# Patient Record
Sex: Female | Born: 1970 | Race: White | Hispanic: No | Smoking: Never smoker
Health system: Southern US, Community
[De-identification: ages and names within clinical notes are randomized; demographics above are authoritative.]

## PROBLEM LIST (undated history)

## (undated) DIAGNOSIS — E039 Hypothyroidism, unspecified: Secondary | ICD-10-CM

## (undated) DIAGNOSIS — H579 Unspecified disorder of eye and adnexa: Secondary | ICD-10-CM

## (undated) DIAGNOSIS — N649 Disorder of breast, unspecified: Secondary | ICD-10-CM

## (undated) DIAGNOSIS — M797 Fibromyalgia: Secondary | ICD-10-CM

## (undated) DIAGNOSIS — C801 Malignant (primary) neoplasm, unspecified: Secondary | ICD-10-CM

## (undated) DIAGNOSIS — H3552 Pigmentary retinal dystrophy: Secondary | ICD-10-CM

## (undated) DIAGNOSIS — E079 Disorder of thyroid, unspecified: Secondary | ICD-10-CM

## (undated) DIAGNOSIS — C50919 Malignant neoplasm of unspecified site of unspecified female breast: Secondary | ICD-10-CM

## (undated) DIAGNOSIS — T7840XA Allergy, unspecified, initial encounter: Secondary | ICD-10-CM

## (undated) HISTORY — PX: BREAST SURGERY: SHX581

## (undated) HISTORY — PX: PORTACATH PLACEMENT: SHX2246

## (undated) HISTORY — DX: Disorder of breast, unspecified: N64.9

## (undated) HISTORY — DX: Allergy, unspecified, initial encounter: T78.40XA

## (undated) HISTORY — PX: CHOLECYSTECTOMY: SHX55

## (undated) HISTORY — PX: PORT-A-CATH REMOVAL: SHX5289

## (undated) HISTORY — DX: Disorder of thyroid, unspecified: E07.9

---
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
## 1999-02-02 ENCOUNTER — Inpatient Hospital Stay (HOSPITAL_COMMUNITY): Admission: AD | Admit: 1999-02-02 | Discharge: 1999-02-04 | Payer: Self-pay | Admitting: *Deleted

## 1999-02-03 ENCOUNTER — Encounter: Payer: Self-pay | Admitting: Obstetrics & Gynecology

## 1999-02-05 ENCOUNTER — Inpatient Hospital Stay (HOSPITAL_COMMUNITY): Admission: AD | Admit: 1999-02-05 | Discharge: 1999-02-05 | Payer: Self-pay | Admitting: Obstetrics

## 1999-02-11 ENCOUNTER — Inpatient Hospital Stay (HOSPITAL_COMMUNITY): Admission: AD | Admit: 1999-02-11 | Discharge: 1999-02-11 | Payer: Self-pay | Admitting: *Deleted

## 1999-08-21 ENCOUNTER — Other Ambulatory Visit: Admission: RE | Admit: 1999-08-21 | Discharge: 1999-08-21 | Payer: Self-pay | Admitting: Obstetrics and Gynecology

## 1999-12-11 ENCOUNTER — Inpatient Hospital Stay (HOSPITAL_COMMUNITY): Admission: AD | Admit: 1999-12-11 | Discharge: 1999-12-11 | Payer: Self-pay | Admitting: Obstetrics and Gynecology

## 2003-02-02 ENCOUNTER — Other Ambulatory Visit: Admission: RE | Admit: 2003-02-02 | Discharge: 2003-02-02 | Payer: Self-pay | Admitting: Family Medicine

## 2005-06-16 ENCOUNTER — Ambulatory Visit: Payer: Self-pay | Admitting: Family Medicine

## 2013-03-17 ENCOUNTER — Encounter (HOSPITAL_COMMUNITY): Payer: Self-pay

## 2013-03-17 ENCOUNTER — Emergency Department (HOSPITAL_COMMUNITY)
Admission: EM | Admit: 2013-03-17 | Discharge: 2013-03-17 | Disposition: A | Discharge status: Home / Self Care | Payer: Medicaid Other | Attending: Emergency Medicine | Admitting: Emergency Medicine

## 2013-03-17 DIAGNOSIS — Z8669 Personal history of other diseases of the nervous system and sense organs: Secondary | ICD-10-CM | POA: Insufficient documentation

## 2013-03-17 DIAGNOSIS — H9209 Otalgia, unspecified ear: Secondary | ICD-10-CM | POA: Insufficient documentation

## 2013-03-17 DIAGNOSIS — H9203 Otalgia, bilateral: Secondary | ICD-10-CM

## 2013-03-17 HISTORY — DX: Unspecified disorder of eye and adnexa: H57.9

## 2013-03-17 MED ORDER — PSEUDOEPHEDRINE HCL 60 MG PO TABS: 60.0000 mg | ORAL_TABLET | Freq: Four times a day (QID) | ORAL | Status: DC | PRN

## 2013-03-17 NOTE — ED Notes (Signed)
Bilateral ear pain for 1 week.alert, NAD

## 2013-03-17 NOTE — ED Notes (Signed)
Pt reports bil earaches for 1 week.

## 2013-03-20 NOTE — ED Provider Notes (Signed)
CSN: [id number]     Arrival date & time 03/17/13  1059 History   First MD Initiated Contact with Patient 03/17/13 1105     Chief Complaint  Patient presents with  . Otalgia   (Consider location/radiation/quality/duration/timing/severity/associated sxs/prior Treatment) Patient is a 42 y.o. female presenting with ear pain. The history is provided by the patient.  Otalgia Location:  Bilateral Behind ear:  No abnormality Quality:  Aching and pressure Severity:  Mild Onset quality:  Gradual Duration:  1 week Timing:  Sporadic Progression:  Unchanged Chronicity:  New Relieved by:  Nothing Worsened by:  Nothing tried Ineffective treatments:  None tried Associated symptoms: congestion   Associated symptoms: no abdominal pain, no cough, no ear discharge, no fever, no headaches, no hearing loss, no neck pain, no rash, no rhinorrhea, no sore throat and no tinnitus   Congestion:    Location:  Nasal   Interferes with sleep: no     Interferes with eating/drinking: no     Past Medical History  Diagnosis Date  . Eye disease    Past Surgical History  Procedure Laterality Date  . Cholecystectomy     No family history on file. History  Substance Use Topics  . Smoking status: Never Smoker   . Smokeless tobacco: Not on file  . Alcohol Use: No   OB History   Grav Para Term Preterm Abortions TAB SAB Ect Mult Living                 Review of Systems  Constitutional: Negative for fever.  HENT: Positive for ear pain and congestion. Negative for hearing loss, sore throat, rhinorrhea, neck pain, tinnitus and ear discharge.   Eyes: Negative.   Respiratory: Negative for cough, chest tightness and shortness of breath.   Cardiovascular: Negative for chest pain.  Gastrointestinal: Negative for nausea and abdominal pain.  Genitourinary: Negative.   Musculoskeletal: Negative for joint swelling and arthralgias.  Skin: Negative.  Negative for rash and wound.  Neurological: Negative for  dizziness, weakness, light-headedness, numbness and headaches.  Psychiatric/Behavioral: Negative.     Allergies  Sulfamethoxazole w-trimethoprim  Home Medications   Current Outpatient Rx  Name  Route  Sig  Dispense  Refill  . pseudoephedrine (SUDAFED) 60 MG tablet   Oral   Take 1 tablet (60 mg total) by mouth every 6 (six) hours as needed for congestion (and ear pressure).   30 tablet   0    BP 115/77  Pulse 84  Temp(Src) 98 F (36.7 C) (Oral)  Resp 18  Ht 5\' 6"  (1.676 m)  Wt 170 lb (77.111 kg)  BMI 27.45 kg/m2  SpO2 100%  LMP 03/01/2013 Physical Exam  Constitutional: She is oriented to person, place, and time. She appears well-developed and well-nourished.  HENT:  Head: Normocephalic and atraumatic.  Right Ear: Tympanic membrane and ear canal normal. No drainage or swelling. No mastoid tenderness. Tympanic membrane is not injected and not retracted.  Left Ear: Tympanic membrane and ear canal normal. No drainage or swelling. No mastoid tenderness. Tympanic membrane is not injected and not retracted.  Nose: Mucosal edema present. No rhinorrhea.  Mouth/Throat: Uvula is midline, oropharynx is clear and moist and mucous membranes are normal. No oropharyngeal exudate, posterior oropharyngeal edema, posterior oropharyngeal erythema or tonsillar abscesses.  Eyes: Conjunctivae are normal.  Cardiovascular: Normal rate and normal heart sounds.   Pulmonary/Chest: Effort normal. No respiratory distress. She has no decreased breath sounds. She has no wheezes. She has no rhonchi. She  has no rales.  Abdominal: Soft. There is no tenderness.  Musculoskeletal: Normal range of motion.  Neurological: She is alert and oriented to person, place, and time.  Skin: Skin is warm and dry. No rash noted.  Psychiatric: She has a normal mood and affect.    ED Course  Procedures (including critical care time) Labs Review Labs Reviewed - No data to display Imaging Review No results found.  MDM    1. Earache, bilateral    Eustachian dysfunction with nasal congestion.  Pt prescribed pseudoephedrine,  Encouraged menthol drops, peppermints, auralgan.  F/u with pcp if not improving.    The patient appears reasonably screened and/or stabilized for discharge and I doubt any other medical condition or other [facility] requiring further screening, evaluation, or treatment in the ED at this time prior to discharge.     Burgess Amor, PA-C 03/20/13 2308

## 2013-03-23 NOTE — ED Provider Notes (Signed)
Medical screening examination/treatment/procedure(s) were performed by non-physician practitioner and as supervising physician I was immediately available for consultation/collaboration.  Aryia Delira, MD 03/23/13 1103 

## 2013-07-13 ENCOUNTER — Emergency Department (HOSPITAL_COMMUNITY)
Admission: EM | Admit: 2013-07-13 | Discharge: 2013-07-13 | Disposition: A | Discharge status: Home / Self Care | Payer: Medicaid Other | Attending: Emergency Medicine | Admitting: Emergency Medicine

## 2013-07-13 ENCOUNTER — Emergency Department (HOSPITAL_COMMUNITY): Payer: Medicaid Other

## 2013-07-13 ENCOUNTER — Encounter (HOSPITAL_COMMUNITY): Payer: Self-pay | Admitting: Emergency Medicine

## 2013-07-13 DIAGNOSIS — R509 Fever, unspecified: Secondary | ICD-10-CM | POA: Insufficient documentation

## 2013-07-13 DIAGNOSIS — IMO0001 Reserved for inherently not codable concepts without codable children: Secondary | ICD-10-CM | POA: Insufficient documentation

## 2013-07-13 DIAGNOSIS — J069 Acute upper respiratory infection, unspecified: Secondary | ICD-10-CM | POA: Insufficient documentation

## 2013-07-13 DIAGNOSIS — R6889 Other general symptoms and signs: Secondary | ICD-10-CM | POA: Insufficient documentation

## 2013-07-13 DIAGNOSIS — Z8669 Personal history of other diseases of the nervous system and sense organs: Secondary | ICD-10-CM | POA: Insufficient documentation

## 2013-07-13 HISTORY — DX: Fibromyalgia: M79.7

## 2013-07-13 MED ORDER — IBUPROFEN 800 MG PO TABS: 800.0000 mg | ORAL_TABLET | Freq: Three times a day (TID) | ORAL | Status: DC

## 2013-07-13 MED ORDER — GUAIFENESIN-CODEINE 100-10 MG/5ML PO SYRP: 10.0000 mL | ORAL_SOLUTION | Freq: Three times a day (TID) | ORAL | Status: DC | PRN

## 2013-07-13 NOTE — Discharge Instructions (Signed)
Cough, Adult  A cough is a reflex. It helps you clear your throat and airways. A cough can help heal your body. A cough can last 2 or 3 weeks (acute) or may last more than 8 weeks (chronic). Some common causes of a cough can include an infection, allergy, or a cold. HOME CARE  Only take medicine as told by your doctor.  If given, take your medicines (antibiotics) as told. Finish them even if you start to feel better.  Use a cold steam vaporizer or humidier in your home. This can help loosen thick spit (secretions).  Sleep so you are almost sitting up (semi-upright). Use pillows to do this. This helps reduce coughing.  Rest as needed.  Stop smoking if you smoke. GET HELP RIGHT AWAY IF:  You have yellowish-white fluid (pus) in your thick spit.  Your cough gets worse.  Your medicine does not reduce coughing, and you are losing sleep.  You cough up blood.  You have trouble breathing.  Your pain gets worse and medicine does not help.  You have a fever. MAKE SURE YOU:   Understand these instructions.  Will watch your condition.  Will get help right away if you are not doing well or get worse. Document Released: 02/19/2011 Document Revised: 08/31/2011 Document Reviewed: 02/19/2011 [facility] Patient Information 2014 [place].  Upper Respiratory Infection, Adult An upper respiratory infection (URI) is also known as the common cold. It is often caused by a type of germ (virus). Colds are easily spread (contagious). You can pass it to others by kissing, coughing, sneezing, or drinking out of the same glass. Usually, you get better in 1 or 2 weeks.  HOME CARE   Only take medicine as told by your doctor.  Use a warm mist humidifier or breathe in steam from a hot shower.  Drink enough water and fluids to keep your pee (urine) clear or pale yellow.  Get plenty of rest.  Return to work when your temperature is back to normal or as told by your doctor. You may use a face mask  and wash your hands to stop your cold from spreading. GET HELP RIGHT AWAY IF:   After the first few days, you feel you are getting worse.  You have questions about your medicine.  You have chills, shortness of breath, or brown or red spit (mucus).  You have yellow or brown snot (nasal discharge) or pain in the face, especially when you bend forward.  You have a fever, puffy (swollen) neck, pain when you swallow, or white spots in the back of your throat.  You have a bad headache, ear pain, sinus pain, or chest pain.  You have a high-pitched whistling sound when you breathe in and out (wheezing).  You have a lasting cough or cough up blood.  You have sore muscles or a stiff neck. MAKE SURE YOU:   Understand these instructions.  Will watch your condition.  Will get help right away if you are not doing well or get worse. Document Released: 11/25/2007 Document Revised: 08/31/2011 Document Reviewed: 10/13/2010 [facility] Patient Information 2014 [place], [place].

## 2013-07-13 NOTE — ED Notes (Signed)
Pt c/o cough, congestion, fever, body aches x 2 weeks.

## 2013-07-15 NOTE — ED Provider Notes (Signed)
CSN: [id number]     Arrival date & time 07/13/13  1031 History   First MD Initiated Contact with Patient 07/13/13 1118     Chief Complaint  Patient presents with  . URI   (Consider location/radiation/quality/duration/timing/severity/associated sxs/prior Treatment) Patient is a 43 y.o. female presenting with URI. The history is provided by the patient.  URI Presenting symptoms: congestion, cough, fever and rhinorrhea   Presenting symptoms: no facial pain and no sore throat   Congestion:    Location:  Nasal and chest   Interferes with sleep: no     Interferes with eating/drinking: no   Cough:    Cough characteristics:  Non-productive   Severity:  Mild   Onset quality:  Gradual   Duration:  2 weeks   Timing:  Intermittent   Progression:  Unchanged   Chronicity:  New Fever:    Timing:  Intermittent   Temp source:  Subjective   Progression:  Unable to specify Rhinorrhea:    Quality:  Clear   Severity:  Moderate   Timing:  Constant   Progression:  Unchanged Severity:  Mild Progression:  Unchanged Worsened by:  Nothing tried Ineffective treatments:  None tried Associated symptoms: myalgias and sneezing   Associated symptoms: no arthralgias, no headaches, no neck pain, no sinus pain, no swollen glands and no wheezing   Myalgias:    Location:  Generalized   Quality:  Aching   Severity:  Moderate   Onset quality:  Gradual   Progression:  Waxing and waning Risk factors: sick contacts   Risk factors: not elderly, no chronic respiratory disease and no immunosuppression     Past Medical History  Diagnosis Date  . Eye disease   . Fibromyalgia    Past Surgical History  Procedure Laterality Date  . Cholecystectomy     No family history on file. History  Substance Use Topics  . Smoking status: Never Smoker   . Smokeless tobacco: Not on file  . Alcohol Use: No   OB History   Grav Para Term Preterm Abortions TAB SAB Ect Mult Living                 Review of Systems   Constitutional: Positive for fever. Negative for chills, activity change and appetite change.  HENT: Positive for congestion, rhinorrhea and sneezing. Negative for sore throat.   Respiratory: Positive for cough. Negative for chest tightness, shortness of breath and wheezing.   Gastrointestinal: Negative for nausea, vomiting and abdominal pain.  Genitourinary: Negative for dysuria, frequency and flank pain.  Musculoskeletal: Positive for myalgias. Negative for arthralgias and neck pain.  Neurological: Negative for weakness, numbness and headaches.  All other systems reviewed and are negative.    Allergies  Sulfamethoxazole-trimethoprim  Home Medications   Current Outpatient Rx  Name  Route  Sig  Dispense  Refill  . guaiFENesin-codeine (ROBITUSSIN AC) 100-10 MG/5ML syrup   Oral   Take 10 mLs by mouth 3 (three) times daily as needed for cough.   120 mL   0   . ibuprofen (ADVIL,MOTRIN) 800 MG tablet   Oral   Take 1 tablet (800 mg total) by mouth 3 (three) times daily.   21 tablet   0    BP 124/82  Pulse 117  Temp(Src) 99 F (37.2 C) (Oral)  Resp 20  Ht 5\' 6"  (1.676 m)  Wt 170 lb (77.111 kg)  BMI 27.45 kg/m2  SpO2 100%  LMP 06/13/2013 Physical Exam  Nursing note and vitals  reviewed. Constitutional: She is oriented to person, place, and time. She appears well-developed and well-nourished. No distress.  HENT:  Head: Normocephalic and atraumatic.  Right Ear: Tympanic membrane and ear canal normal.  Left Ear: Tympanic membrane and ear canal normal.  Nose: Mucosal edema and rhinorrhea present.  Mouth/Throat: Uvula is midline and mucous membranes are normal. Posterior oropharyngeal erythema present. No oropharyngeal exudate, posterior oropharyngeal edema or tonsillar abscesses.  Neck: Normal range of motion, full passive range of motion without pain and phonation normal. Neck supple. No Brudzinski's sign and no Kernig's sign noted.  Cardiovascular: Normal rate, regular  rhythm, normal heart sounds and intact distal pulses.   No murmur heard. Pulmonary/Chest: Effort normal and breath sounds normal. No respiratory distress.  Musculoskeletal: Normal range of motion.  Lymphadenopathy:    She has no cervical adenopathy.  Neurological: She is alert and oriented to person, place, and time. She exhibits normal muscle tone. Coordination normal.  Skin: Skin is warm and dry. No rash noted.    ED Course  Procedures (including critical care time) Labs Review Labs Reviewed - No data to display Imaging Review No results found.  EKG Interpretation   None       MDM   1. URI (upper respiratory infection)    Sx's likely related to viral illness.  VSS.  Pt is non-toxic appearing.  Agrees to symptomatic treatment with ibuprofen and robitussin AC    Michalle Rademaker L. Vanessa Spade, PA-C 07/15/13 2233

## 2013-07-16 NOTE — ED Provider Notes (Signed)
Medical screening examination/treatment/procedure(s) were performed by non-physician practitioner and as supervising physician I was immediately available for consultation/collaboration.  EKG Interpretation   None         Alfonzo Feller, DO 07/16/13 1130

## 2013-10-16 ENCOUNTER — Other Ambulatory Visit (HOSPITAL_COMMUNITY): Payer: Self-pay | Admitting: Internal Medicine

## 2013-10-16 ENCOUNTER — Other Ambulatory Visit (HOSPITAL_COMMUNITY): Payer: Self-pay | Admitting: Family Medicine

## 2013-10-16 DIAGNOSIS — Z1231 Encounter for screening mammogram for malignant neoplasm of breast: Secondary | ICD-10-CM

## 2013-10-19 ENCOUNTER — Ambulatory Visit (HOSPITAL_COMMUNITY)
Admission: RE | Admit: 2013-10-19 | Discharge: 2013-10-19 | Disposition: A | Discharge status: Home / Self Care | Payer: Medicaid Other | Source: Ambulatory Visit | Attending: Internal Medicine | Admitting: Internal Medicine

## 2013-10-19 DIAGNOSIS — Z1231 Encounter for screening mammogram for malignant neoplasm of breast: Secondary | ICD-10-CM | POA: Insufficient documentation

## 2013-10-27 ENCOUNTER — Encounter: Payer: Self-pay | Admitting: *Deleted

## 2013-10-31 ENCOUNTER — Other Ambulatory Visit: Payer: Self-pay | Admitting: Adult Health

## 2013-11-07 ENCOUNTER — Ambulatory Visit (INDEPENDENT_AMBULATORY_CARE_PROVIDER_SITE_OTHER): Payer: Medicaid Other | Admitting: Adult Health

## 2013-11-07 ENCOUNTER — Other Ambulatory Visit (HOSPITAL_COMMUNITY)
Admission: RE | Admit: 2013-11-07 | Discharge: 2013-11-07 | Disposition: A | Discharge status: Home / Self Care | Payer: Medicaid Other | Source: Ambulatory Visit | Attending: Adult Health | Admitting: Adult Health

## 2013-11-07 ENCOUNTER — Encounter: Payer: Self-pay | Admitting: Adult Health

## 2013-11-07 VITALS — BP 120/72 | HR 78 | Ht 65.0 in | Wt 180.0 lb

## 2013-11-07 DIAGNOSIS — Z01419 Encounter for gynecological examination (general) (routine) without abnormal findings: Secondary | ICD-10-CM | POA: Insufficient documentation

## 2013-11-07 DIAGNOSIS — Z0000 Encounter for general adult medical examination without abnormal findings: Secondary | ICD-10-CM

## 2013-11-07 DIAGNOSIS — Z1151 Encounter for screening for human papillomavirus (HPV): Secondary | ICD-10-CM | POA: Insufficient documentation

## 2013-11-07 DIAGNOSIS — Z1212 Encounter for screening for malignant neoplasm of rectum: Secondary | ICD-10-CM

## 2013-11-07 LAB — HEMOCCULT GUIAC POC 1CARD (OFFICE): FECAL OCCULT BLD: NEGATIVE

## 2013-11-07 NOTE — Progress Notes (Signed)
Patient ID: Kathryn Skinner, female   DOB: 1970/12/29, 43 y.o.   MRN: [id number] History of Present Illness: Kathryn Skinner is a 43 year old white female, single,new to this practice in for a pap and physical.   Current Medications, Allergies, Past Medical History, Past Surgical History, Family History and Social History were reviewed in [facility] record.     Review of Systems: Patient denies any headaches, blurred vision, shortness of breath, chest pain, abdominal pain, problems with bowel movements, urination, or intercourse. No joint swelling or mood swings. She does not use birth control, has been with partner over 2 years.   Physical Exam:BP 120/72  Pulse 78  Ht 5\' 5"  (1.651 m)  Wt 180 lb (81.647 kg)  BMI 29.95 kg/m2  LMP 11/02/2013 General:  Well developed, well nourished, no acute distress Skin:  Warm and dry Neck:  Midline trachea, normal thyroid Lungs; Clear to auscultation bilaterally Breast:  No dominant palpable mass, retraction, or nipple discharge Cardiovascular: Regular rate and rhythm Abdomen:  Soft, non tender, no hepatosplenomegaly Pelvic:  External genitalia is normal in appearance.  The vagina is normal in appearance. The cervix is bulbous, with several nabothian cysts, pap performed with HPV.  Uterus is felt to be normal size, shape, and contour.  No adnexal masses or tenderness noted. Rectal: Good sphincter tone, no polyps, or hemorrhoids felt.  Hemoccult negative. Extremities:  No swelling or varicosities noted Psych:  No mood changes, alert and cooperative,seems happy   Impression: Yearly gyn exam    Plan: Physical in 1 year Pap in 3 years if HPV negative Mammogram yearly Labs with PCP

## 2013-11-07 NOTE — Patient Instructions (Signed)
Physical in 1 year Mammogram yearly Labs PCP

## 2014-04-23 ENCOUNTER — Encounter: Payer: Self-pay | Admitting: Adult Health

## 2014-06-22 DIAGNOSIS — C50919 Malignant neoplasm of unspecified site of unspecified female breast: Secondary | ICD-10-CM

## 2014-06-22 DIAGNOSIS — C801 Malignant (primary) neoplasm, unspecified: Secondary | ICD-10-CM

## 2014-06-22 HISTORY — DX: Malignant neoplasm of unspecified site of unspecified female breast: C50.919

## 2014-06-22 HISTORY — DX: Malignant (primary) neoplasm, unspecified: C80.1

## 2015-06-23 HISTORY — PX: MASTECTOMY: SHX3

## 2015-09-13 DIAGNOSIS — Z9013 Acquired absence of bilateral breasts and nipples: Secondary | ICD-10-CM | POA: Insufficient documentation

## 2015-09-17 ENCOUNTER — Emergency Department (HOSPITAL_COMMUNITY)
Admission: EM | Admit: 2015-09-17 | Discharge: 2015-09-17 | Disposition: A | Discharge status: Home / Self Care | Payer: Medicaid Other

## 2015-09-17 NOTE — ED Notes (Signed)
Called x 2 no answer

## 2015-09-17 NOTE — ED Notes (Signed)
Pt called to triage x 2 not in waiting area.

## 2015-09-17 NOTE — ED Notes (Signed)
Called x 1 no answer

## 2015-11-24 ENCOUNTER — Encounter (HOSPITAL_COMMUNITY): Payer: Self-pay

## 2015-11-24 ENCOUNTER — Inpatient Hospital Stay (HOSPITAL_COMMUNITY)
Admission: EM | Admit: 2015-11-24 | Discharge: 2015-11-28 | DRG: 392 | Disposition: A | Discharge status: Home / Self Care | Payer: Medicaid Other | Attending: Internal Medicine | Admitting: Internal Medicine

## 2015-11-24 DIAGNOSIS — E860 Dehydration: Secondary | ICD-10-CM | POA: Diagnosis present

## 2015-11-24 DIAGNOSIS — R197 Diarrhea, unspecified: Secondary | ICD-10-CM | POA: Diagnosis present

## 2015-11-24 DIAGNOSIS — E876 Hypokalemia: Secondary | ICD-10-CM | POA: Diagnosis present

## 2015-11-24 DIAGNOSIS — K529 Noninfective gastroenteritis and colitis, unspecified: Principal | ICD-10-CM | POA: Diagnosis present

## 2015-11-24 DIAGNOSIS — Z888 Allergy status to other drugs, medicaments and biological substances status: Secondary | ICD-10-CM

## 2015-11-24 DIAGNOSIS — Z9221 Personal history of antineoplastic chemotherapy: Secondary | ICD-10-CM

## 2015-11-24 DIAGNOSIS — E039 Hypothyroidism, unspecified: Secondary | ICD-10-CM | POA: Diagnosis present

## 2015-11-24 DIAGNOSIS — Z9049 Acquired absence of other specified parts of digestive tract: Secondary | ICD-10-CM

## 2015-11-24 DIAGNOSIS — Z22322 Carrier or suspected carrier of Methicillin resistant Staphylococcus aureus: Secondary | ICD-10-CM

## 2015-11-24 DIAGNOSIS — E870 Hyperosmolality and hypernatremia: Secondary | ICD-10-CM | POA: Diagnosis present

## 2015-11-24 DIAGNOSIS — E878 Other disorders of electrolyte and fluid balance, not elsewhere classified: Secondary | ICD-10-CM | POA: Diagnosis present

## 2015-11-24 DIAGNOSIS — M797 Fibromyalgia: Secondary | ICD-10-CM | POA: Diagnosis present

## 2015-11-24 DIAGNOSIS — Z79899 Other long term (current) drug therapy: Secondary | ICD-10-CM

## 2015-11-24 DIAGNOSIS — C50919 Malignant neoplasm of unspecified site of unspecified female breast: Secondary | ICD-10-CM | POA: Diagnosis present

## 2015-11-24 DIAGNOSIS — T451X5A Adverse effect of antineoplastic and immunosuppressive drugs, initial encounter: Secondary | ICD-10-CM | POA: Diagnosis present

## 2015-11-24 DIAGNOSIS — R000 Tachycardia, unspecified: Secondary | ICD-10-CM | POA: Diagnosis present

## 2015-11-24 DIAGNOSIS — Z923 Personal history of irradiation: Secondary | ICD-10-CM

## 2015-11-24 HISTORY — DX: Malignant (primary) neoplasm, unspecified: C80.1

## 2015-11-24 LAB — COMPREHENSIVE METABOLIC PANEL
ALK PHOS: 102 U/L (ref 38–126)
ALT: 25 U/L (ref 14–54)
AST: 29 U/L (ref 15–41)
Albumin: 3.1 g/dL — ABNORMAL LOW (ref 3.5–5.0)
Anion gap: 9 (ref 5–15)
BUN: 12 mg/dL (ref 6–20)
CALCIUM: 8.4 mg/dL — AB (ref 8.9–10.3)
CO2: 23 mmol/L (ref 22–32)
CREATININE: 0.99 mg/dL (ref 0.44–1.00)
Chloride: 104 mmol/L (ref 101–111)
Glucose, Bld: 138 mg/dL — ABNORMAL HIGH (ref 65–99)
Potassium: 2.8 mmol/L — ABNORMAL LOW (ref 3.5–5.1)
SODIUM: 136 mmol/L (ref 135–145)
Total Bilirubin: 0.9 mg/dL (ref 0.3–1.2)
Total Protein: 6 g/dL — ABNORMAL LOW (ref 6.5–8.1)

## 2015-11-24 LAB — CBC
HCT: 38.3 % (ref 36.0–46.0)
Hemoglobin: 13.1 g/dL (ref 12.0–15.0)
MCH: 29.4 pg (ref 26.0–34.0)
MCHC: 34.2 g/dL (ref 30.0–36.0)
MCV: 85.9 fL (ref 78.0–100.0)
PLATELETS: 235 10*3/uL (ref 150–400)
RBC: 4.46 MIL/uL (ref 3.87–5.11)
RDW: 18.7 % — ABNORMAL HIGH (ref 11.5–15.5)
WBC: 5 10*3/uL (ref 4.0–10.5)

## 2015-11-24 LAB — I-STAT BETA HCG BLOOD, ED (MC, WL, AP ONLY)

## 2015-11-24 LAB — I-STAT CG4 LACTIC ACID, ED: LACTIC ACID, VENOUS: 1.51 mmol/L (ref 0.5–2.0)

## 2015-11-24 LAB — LIPASE, BLOOD: Lipase: 32 U/L (ref 11–51)

## 2015-11-24 MED ORDER — IOPAMIDOL (ISOVUE-300) INJECTION 61%
INTRAVENOUS | Status: AC
  Administered 2015-11-25: 100 mL
  Filled 2015-11-24: qty 100

## 2015-11-24 MED ORDER — HYDROMORPHONE HCL 1 MG/ML IJ SOLN
0.5000 mg | Freq: Once | INTRAMUSCULAR | Status: AC
  Administered 2015-11-24: 0.5 mg via INTRAVENOUS
  Filled 2015-11-24: qty 1

## 2015-11-24 MED ORDER — SODIUM CHLORIDE 0.9 % IV BOLUS (SEPSIS)
1000.0000 mL | Freq: Once | INTRAVENOUS | Status: AC
  Administered 2015-11-24: 1000 mL via INTRAVENOUS

## 2015-11-24 MED ORDER — DICYCLOMINE HCL 10 MG/ML IM SOLN
20.0000 mg | Freq: Once | INTRAMUSCULAR | Status: AC
Start: 2015-11-24 — End: 2015-11-24
  Administered 2015-11-24: 20 mg via INTRAMUSCULAR
  Filled 2015-11-24: qty 2

## 2015-11-24 MED ORDER — POTASSIUM CHLORIDE 10 MEQ/100ML IV SOLN
10.0000 meq | Freq: Once | INTRAVENOUS | Status: AC
  Administered 2015-11-24: 10 meq via INTRAVENOUS
  Filled 2015-11-24: qty 100

## 2015-11-24 MED ORDER — ONDANSETRON HCL 4 MG/2ML IJ SOLN
4.0000 mg | Freq: Once | INTRAMUSCULAR | Status: AC
  Administered 2015-11-24: 4 mg via INTRAVENOUS
  Filled 2015-11-24: qty 2

## 2015-11-24 NOTE — ED Provider Notes (Signed)
MSE was initiated and I personally evaluated the patient and placed orders (if any) at  10:24 PM on November 24, 2015.  The patient appears stable so that the remainder of the MSE may be completed by another provider.  45 y.o. female with active breast cancer, currently undergoing radiation. Chemo recently stopped due to severe GI side effects. Patient reports 2 weeks of inability to tolerate PO intake and copious watery diarrhea. Tachycardic to 135. Fluids ordered.   Berenice Primas, MD 11/24/15 [id number]  Elnora Morrison, MD 11/24/15 [phone]

## 2015-11-24 NOTE — ED Notes (Signed)
Patient complains of abdominal pain with 2 weeks of diarrhea and emesis with any intake. Patient is currently receiving radiation for breast cancer and was taken off oral chemo this past Friday due to the GI symptoms. Has taken 2 boxes of immodium with no relief. Complains of extreme weakness

## 2015-11-24 NOTE — ED Provider Notes (Signed)
CSN: [id number]     Arrival date & time 11/24/15  2112 History   First MD Initiated Contact with Patient 11/24/15 2228     Chief Complaint  Patient presents with  . Abdominal Pain  . Diarrhea     (Consider location/radiation/quality/duration/timing/severity/associated sxs/prior Treatment) HPI   Patient is a 45 year old female history of breast cancer currently receiving radiation treatment and oral chemotherapy which was stopped Friday morning 2 days ago for severe GI side effects including profuse watery diarrhea and abdominal pain. Patient estimates 5 watery bowel movements the past 24 hours. She reports using 2 entire boxes of Imodium without any improvement in her stools. Frequent watery stools have been persistent like this for the past 2 weeks, foul-smelling. She states that eating or drinking anything causes worsening abdominal pain that is more intense in the left lower quadrant with some radiation to her left side and left flank. She denies any vomiting. Patient denies any recent hospitalization, last hospitalization was in March. No known sick contacts, no recent travel, no recent antibiotic use. The past 6 months she was receiving chemotherapy infusions and 2 weeks ago she was transitioned to oral chemotherapy which coincides when her symptoms began. Her oncologist is Dr. Georgiann Cocker in [place], Friday morning she took her last dose of oral chemotherapy at the direction of her oncologist to advise her to stop because of her severe GI symptoms.  She denies any fevers, chills, sweats.  No rash.  She has chest wall pain where has burns secondary to radiation therapy but she otherwise denies any chest pain, shortness of breath.    Past Medical History  Diagnosis Date  . Eye disease   . Fibromyalgia   . Cancer [facility])    Past Surgical History  Procedure Laterality Date  . Cholecystectomy     Family History  Problem Relation Age of Onset  . Thyroid disease Mother   . Thyroid disease  Sister   . Asthma Sister   . Thyroid disease Maternal Grandmother   . Emphysema Maternal Grandmother   . Liver disease Maternal Aunt   . Alcohol abuse Maternal Grandfather    Social History  Substance Use Topics  . Smoking status: Never Smoker   . Smokeless tobacco: Never Used  . Alcohol Use: No   OB History    Gravida Para Term Preterm AB TAB SAB Ectopic Multiple Living   2 1   1  1   1      Review of Systems  All other systems reviewed and are negative.     Allergies  Sulfamethoxazole-trimethoprim  Home Medications   Prior to Admission medications   Medication Sig Start Date End Date Taking? Authorizing Provider  levothyroxine (SYNTHROID, LEVOTHROID) 50 MCG tablet Take 50 mcg by mouth daily before breakfast. 11/20/15  Yes Historical Provider, MD  ondansetron (ZOFRAN) 4 MG tablet Take 4 mg by mouth every 4 (four) hours as needed for nausea or vomiting.  09/14/15  Yes Historical Provider, MD  PRESCRIPTION MEDICATION Take 1 tablet by mouth 2 (two) times daily. Unknown chemo drug from [facility] Provider, MD   BP 110/80 mmHg  Pulse 135  Temp(Src) 98.3 F (36.8 C) (Oral)  Resp 16  Ht 5\' 6"  (1.676 m)  Wt 78.155 kg  BMI 27.82 kg/m2  SpO2 97%  LMP 11/02/2013 Physical Exam  Constitutional: She is oriented to person, place, and time. She appears well-developed and well-nourished. No distress.  HENT:  Head: Normocephalic and atraumatic.  Nose:  Nose normal.  Mouth/Throat: Oropharynx is clear and moist. No oropharyngeal exudate.  Eyes: Conjunctivae and EOM are normal. Pupils are equal, round, and reactive to light. Right eye exhibits no discharge. Left eye exhibits no discharge. No scleral icterus.  Neck: Normal range of motion. No JVD present. No tracheal deviation present. No thyromegaly present.  Cardiovascular: Normal rate, regular rhythm, normal heart sounds and intact distal pulses.  Exam reveals no gallop and no friction rub.   No murmur heard. Difficult to  palpate bilateral radial pulses, extremities warm, normal capillary refill  Pulmonary/Chest: Effort normal and breath sounds normal. No respiratory distress. She has no wheezes. She has no rales. She exhibits tenderness.  S/p double mastectomy, right upper chest and right axillary skin with severe erythema, raw and sloughing off skin  Abdominal: Soft. Bowel sounds are normal. She exhibits no distension and no mass. There is tenderness. There is rebound. There is no guarding.  Generalized ttp with mild guarding and rebound tenderness, worse in epigastrum and LUQ, decreased BS x 4  Musculoskeletal: Normal range of motion. She exhibits no edema or tenderness.  Lymphadenopathy:    She has no cervical adenopathy.  Neurological: She is alert and oriented to person, place, and time. She has normal reflexes. No cranial nerve deficit. She exhibits normal muscle tone. Coordination normal.  Skin: Skin is warm and dry. No rash noted. She is not diaphoretic. There is erythema. No pallor.  Psychiatric: She has a normal mood and affect. Her behavior is normal. Judgment and thought content normal.  Nursing note and vitals reviewed.   ED Course  Procedures (including critical care time) Labs Review Labs Reviewed  COMPREHENSIVE METABOLIC PANEL - Abnormal; Notable for the following:    Potassium 2.8 (*)    Glucose, Bld 138 (*)    Calcium 8.4 (*)    Total Protein 6.0 (*)    Albumin 3.1 (*)    All other components within normal limits  CBC - Abnormal; Notable for the following:    RDW 18.7 (*)    All other components within normal limits  LIPASE, BLOOD  URINALYSIS, ROUTINE W REFLEX MICROSCOPIC (NOT AT [facility])  I-STAT CG4 LACTIC ACID, ED  I-STAT BETA HCG BLOOD, ED ([facility], [facility], [facility] ONLY)    Imaging Review No results found. I have personally reviewed and evaluated these images and lab results as part of my medical decision-making.   EKG Interpretation None      MDM   Patient is a 45 year old female  with breast cancer, actively undergoing radiation and chemotherapy, presents emergency Department with 2 weeks of severe generalized abdominal pain, worse when attempted to eat or drink anything, with severe frequent watery diarrhea.  Upon presentation to the ER she is initially tachycardic to 135.  MSE screening done by Dr. Marigene Ehlers, with basic labs, IV fluids and CT scan ordered.   In the ER she started to be hypokalemic, 2.8. Normal white count, differential added.  Patient continued to be tachycardic after 1 L normal saline.  10 mEq of potassium replacement were ordered IV, followed by 1L half-normal saline with 20 mEq of potassium administered over 2 hours, currently infusing.  Patient continues to be tachycardic. Her dental pain was improved with Bentyl, Zofran, Dilaudid.   CT abdomen pelvis is pertinent for colitis, suspicious for infectious etiology, recommended stool cultures. Since being in the ER the patient has not had any loose stools, discussed this with the patient and did order him a GI panel and C.  difficile, patient will alert [place] if she is able to provide a sample.  She was able to tolerate by mouth Cipro and Flagyl. Magnesium level added, will replace once resulted if needed.  Case discussed with Dr. Lossie Faes who agrees with admission for further treatment Tried hospitalist paged, discussed the case with Dr. Eulas Post, who will admit the patient to telemetry OBS for further workup and treatment of colitis, hypokalemia, sinus tachycardia secondary to dehydration/infection.  Final diagnoses:  Colitis  Hypokalemia  Diarrhea, unspecified type  Sinus tachycardia (HCC)      Delsa Grana, PA-C 11/25/15 0246  Everlene Balls, MD 11/25/15 [phone]

## 2015-11-25 ENCOUNTER — Emergency Department (HOSPITAL_COMMUNITY): Payer: Medicaid Other

## 2015-11-25 DIAGNOSIS — K529 Noninfective gastroenteritis and colitis, unspecified: Secondary | ICD-10-CM | POA: Diagnosis present

## 2015-11-25 DIAGNOSIS — R000 Tachycardia, unspecified: Secondary | ICD-10-CM

## 2015-11-25 DIAGNOSIS — R197 Diarrhea, unspecified: Secondary | ICD-10-CM | POA: Diagnosis present

## 2015-11-25 DIAGNOSIS — E860 Dehydration: Secondary | ICD-10-CM | POA: Diagnosis not present

## 2015-11-25 DIAGNOSIS — E876 Hypokalemia: Secondary | ICD-10-CM | POA: Diagnosis present

## 2015-11-25 LAB — DIFFERENTIAL
BASOS PCT: 0 %
Basophils Absolute: 0 10*3/uL (ref 0.0–0.1)
EOS PCT: 2 %
Eosinophils Absolute: 0.1 10*3/uL (ref 0.0–0.7)
Lymphocytes Relative: 12 %
Lymphs Abs: 0.6 10*3/uL — ABNORMAL LOW (ref 0.7–4.0)
MONO ABS: 1.4 10*3/uL — AB (ref 0.1–1.0)
MONOS PCT: 27 %
NEUTROS ABS: 3.2 10*3/uL (ref 1.7–7.7)
Neutrophils Relative %: 59 %

## 2015-11-25 LAB — C DIFFICILE QUICK SCREEN W PCR REFLEX
C DIFFICLE (CDIFF) ANTIGEN: NEGATIVE
C Diff interpretation: NEGATIVE
C Diff toxin: NEGATIVE

## 2015-11-25 LAB — GASTROINTESTINAL PANEL BY PCR, STOOL (REPLACES STOOL CULTURE)

## 2015-11-25 LAB — MAGNESIUM: Magnesium: 1.5 mg/dL — ABNORMAL LOW (ref 1.7–2.4)

## 2015-11-25 LAB — BASIC METABOLIC PANEL
Anion gap: 5 (ref 5–15)
BUN: 12 mg/dL (ref 6–20)
CHLORIDE: 109 mmol/L (ref 101–111)
CO2: 21 mmol/L — AB (ref 22–32)
Calcium: 7.5 mg/dL — ABNORMAL LOW (ref 8.9–10.3)
Creatinine, Ser: 0.76 mg/dL (ref 0.44–1.00)
GFR calc Af Amer: >60 mL/min (ref >60)
GFR calc non Af Amer: >60 mL/min (ref >60)
Glucose, Bld: 101 mg/dL — ABNORMAL HIGH (ref 65–99)
POTASSIUM: 3.5 mmol/L (ref 3.5–5.1)
SODIUM: 135 mmol/L (ref 135–145)

## 2015-11-25 MED ORDER — MORPHINE SULFATE (PF) 2 MG/ML IV SOLN
2.0000 mg | INTRAVENOUS | Status: DC | PRN
  Administered 2015-11-26: 2 mg via INTRAVENOUS
  Filled 2015-11-25: qty 1

## 2015-11-25 MED ORDER — OXYCODONE HCL 5 MG PO TABS
5.0000 mg | ORAL_TABLET | ORAL | Status: DC | PRN
  Administered 2015-11-26: 5 mg via ORAL
  Filled 2015-11-25: qty 1

## 2015-11-25 MED ORDER — PROMETHAZINE HCL 25 MG/ML IJ SOLN
6.2500 mg | INTRAMUSCULAR | Status: DC | PRN
  Administered 2015-11-26: 6.25 mg via INTRAVENOUS
  Filled 2015-11-25: qty 1

## 2015-11-25 MED ORDER — ONDANSETRON HCL 4 MG/2ML IJ SOLN
4.0000 mg | Freq: Three times a day (TID) | INTRAMUSCULAR | Status: AC | PRN
  Administered 2015-11-25: 4 mg via INTRAVENOUS
  Filled 2015-11-25: qty 2

## 2015-11-25 MED ORDER — RADIAPLEXRX EX GEL
Freq: Once | CUTANEOUS | Status: AC
  Administered 2015-11-25: 16:00:00 via TOPICAL
  Filled 2015-11-25: qty 170

## 2015-11-25 MED ORDER — ENOXAPARIN SODIUM 40 MG/0.4ML ~~LOC~~ SOLN
40.0000 mg | SUBCUTANEOUS | Status: DC
  Administered 2015-11-25 – 2015-11-28 (×3): 40 mg via SUBCUTANEOUS
  Filled 2015-11-25 (×3): qty 0.4

## 2015-11-25 MED ORDER — METRONIDAZOLE IN NACL 5-0.79 MG/ML-% IV SOLN
500.0000 mg | Freq: Three times a day (TID) | INTRAVENOUS | Status: DC
  Administered 2015-11-25 – 2015-11-27 (×7): 500 mg via INTRAVENOUS
  Filled 2015-11-25 (×7): qty 100

## 2015-11-25 MED ORDER — DICYCLOMINE HCL 20 MG PO TABS
20.0000 mg | ORAL_TABLET | Freq: Three times a day (TID) | ORAL | Status: DC
  Administered 2015-11-25 – 2015-11-26 (×3): 20 mg via ORAL
  Filled 2015-11-25 (×6): qty 1

## 2015-11-25 MED ORDER — METRONIDAZOLE 500 MG PO TABS
500.0000 mg | ORAL_TABLET | Freq: Once | ORAL | Status: AC
  Administered 2015-11-25: 500 mg via ORAL
  Filled 2015-11-25: qty 1

## 2015-11-25 MED ORDER — ACETAMINOPHEN 325 MG PO TABS: 650.0000 mg | ORAL_TABLET | Freq: Four times a day (QID) | ORAL | Status: DC | PRN

## 2015-11-25 MED ORDER — POTASSIUM CHLORIDE 20 MEQ PO PACK
60.0000 meq | PACK | Freq: Two times a day (BID) | ORAL | Status: DC
  Administered 2015-11-25 (×2): 60 meq via ORAL
  Filled 2015-11-25 (×3): qty 3

## 2015-11-25 MED ORDER — LOPERAMIDE HCL 2 MG PO CAPS
2.0000 mg | ORAL_CAPSULE | ORAL | Status: DC | PRN
  Administered 2015-11-25 – 2015-11-26 (×2): 2 mg via ORAL
  Filled 2015-11-25 (×2): qty 1

## 2015-11-25 MED ORDER — CIPROFLOXACIN HCL 500 MG PO TABS
500.0000 mg | ORAL_TABLET | Freq: Once | ORAL | Status: AC
  Administered 2015-11-25: 500 mg via ORAL
  Filled 2015-11-25: qty 1

## 2015-11-25 MED ORDER — MAGNESIUM SULFATE IN D5W 1-5 GM/100ML-% IV SOLN
1.0000 g | INTRAVENOUS | Status: AC
  Administered 2015-11-25: 1 g via INTRAVENOUS
  Filled 2015-11-25 (×2): qty 100

## 2015-11-25 MED ORDER — SODIUM CHLORIDE 0.9% FLUSH
3.0000 mL | Freq: Two times a day (BID) | INTRAVENOUS | Status: DC
  Administered 2015-11-26 – 2015-11-27 (×2): 3 mL via INTRAVENOUS

## 2015-11-25 MED ORDER — LEVOFLOXACIN IN D5W 750 MG/150ML IV SOLN
750.0000 mg | INTRAVENOUS | Status: DC
  Administered 2015-11-25 – 2015-11-27 (×3): 750 mg via INTRAVENOUS
  Filled 2015-11-25 (×3): qty 150

## 2015-11-25 MED ORDER — HYDROMORPHONE HCL 1 MG/ML IJ SOLN
0.5000 mg | Freq: Once | INTRAMUSCULAR | Status: AC
  Administered 2015-11-25: 0.5 mg via INTRAVENOUS
  Filled 2015-11-25: qty 1

## 2015-11-25 MED ORDER — ACETAMINOPHEN 650 MG RE SUPP: 650.0000 mg | Freq: Four times a day (QID) | RECTAL | Status: DC | PRN

## 2015-11-25 MED ORDER — POTASSIUM CHLORIDE IN NACL 40-0.9 MEQ/L-% IV SOLN
INTRAVENOUS | Status: DC
  Administered 2015-11-25 (×2): 100 mL/h via INTRAVENOUS
  Administered 2015-11-26 – 2015-11-27 (×2): 125 mL/h via INTRAVENOUS
  Filled 2015-11-25 (×7): qty 1000

## 2015-11-25 MED ORDER — HYDROMORPHONE HCL 1 MG/ML IJ SOLN
0.5000 mg | INTRAMUSCULAR | Status: AC | PRN
  Administered 2015-11-25 (×2): 0.5 mg via INTRAVENOUS
  Filled 2015-11-25 (×2): qty 1

## 2015-11-25 MED ORDER — SODIUM CHLORIDE 0.45 % IV SOLN
Freq: Once | INTRAVENOUS | Status: DC
  Filled 2015-11-25: qty 1000

## 2015-11-25 MED ORDER — LEVOTHYROXINE SODIUM 50 MCG PO TABS
50.0000 ug | ORAL_TABLET | Freq: Every day | ORAL | Status: DC
  Administered 2015-11-25 – 2015-11-28 (×4): 50 ug via ORAL
  Filled 2015-11-25 (×4): qty 1

## 2015-11-25 NOTE — ED Notes (Signed)
Called main lab to add on differential

## 2015-11-25 NOTE — Progress Notes (Signed)
11/25/15  1530  Notified Dr Eliseo Squires regarding pop up screen states patient maybe Septic. BP 108/60 HR 116 T 98.3 R20.

## 2015-11-25 NOTE — Progress Notes (Signed)
Patient admitted after midnight, please see H&P.  Still with severe watery stools and cramping-- c diff negative.  Await GI panel.  ?affect of her chemo med  Eulogio Bear DO

## 2015-11-25 NOTE — ED Notes (Signed)
Admitting at bedside 

## 2015-11-25 NOTE — Progress Notes (Signed)
Pharmacy Antibiotic Note  TAUREAN GAD is a 45 y.o. female admitted on 11/24/2015 with intra-abdominal infection.  Pharmacy has been consulted for Levofloxacin dosing.  Pt with N/V x 2 days and diarrhea x 1 month.  Pt with hx breast CA, currently undergoing radiation treatments.  Her oral chemotherapy was stopped last Friday.  Cr < 1, Levofloxacin will not require adjustment.  She is also receiving Flagyl 500mg  IV q8.  Plan: Levofloxacin 750mg  IV q24 Pharmacy will sign off, please reconsult as needed.  Height: 5\' 6"  (167.6 cm) Weight: 174 lb 11.2 oz (79.243 kg) IBW/kg (Calculated) : 59.3  Temp (24hrs), Avg:98.4 F (36.9 C), Min:98.2 F (36.8 C), Max:98.7 F (37.1 C)   Recent Labs Lab 11/24/15 2130 11/24/15 2142  WBC 5.0  --   CREATININE 0.99  --   LATICACIDVEN  --  1.51    Estimated Creatinine Clearance: 76.2 mL/min (by C-G formula based on Cr of 0.99).    Allergies  Allergen Reactions  . Sulfamethoxazole-Trimethoprim Rash    Antimicrobials this admission: Flagyl 6/5 >>  Levoflox 6/5 >>   Microbiology results: 6/5  Stool cx (p) 6/5  CDiff neg  Thank you for allowing pharmacy to be a part of this patient's care.  Gracy Bruins, PharmD Clinical Pharmacist [facility]

## 2015-11-25 NOTE — Consult Note (Signed)
WOC wound consult note Reason for Consult: radiation burns right axilla  Wound type: wet desquamation right axilla  Measurement: pt not able to really raise the arm much for accurate measurements Wound bed: pink, wet/moist, some yellow exudate consistent with radiation burns Drainage (amount, consistency, odor) see above Periwound: dry desquamation mid chest  Dressing procedure/placement/frequency: Ordered Radiplex gel from [facility], to be over to floor for patient use in a hour or so.  Notified bedside nurse.  Discussed POC with patient and bedside nurse.  Re consult if needed, will not follow at this time. Thanks  Myrtle Haller [place], [place] [phone])

## 2015-11-25 NOTE — ED Notes (Signed)
EDP at bedside  

## 2015-11-25 NOTE — H&P (Signed)
History and Physical    Kathryn Skinner [id number] DOB: Jul 15, 1970 DOA: 11/24/2015  PCP: No PCP Per Patient  She has an oncologist  Patient coming from: Home  Chief Complaint: Diarrhea x one month, nausea and vomiting x 2 days  HPI: Kathryn Skinner is a 45 y.o. woman with a history of breast cancer who is actively undergoing radiation treatments and was started on an oral chemotherapy agent approximately one month ago.  Since that time, she has had nonbloody diarrhea associated with diffuse abdominal pain.  In the past two days, she has developed significant nausea and vomiting (nonbloody emesis) associated with the inability to tolerate PO intake.  No documented fever.  She has been light-headed, but no syncope.  Her abdominal pain "feels like knots that are being pulled", waxing and waning in intensity (10 out of 10 at its worst).  She has only tried OTC imodium for her symptoms (under the direction of her physician), but it has not helped with her symptoms.   ED Course: Evaluation tonight concerning for dehydration with a sinus tachycardia and hypokalemia.  CT of the abdomen and pelvis shows diffuse inflammatory changes involving small and large intestine, concerning for infectious etiology.  Stool studies are pending.  Cipro and flagyl have been ordered.    Review of Systems: As per HPI otherwise 10 point review of systems negative.   Past Medical History  Diagnosis Date  . Eye disease   . Fibromyalgia   . Cancer [facility])   Breast Cancer Hypothyroidism  Past Surgical History  Procedure Laterality Date  . Cholecystectomy       reports that she has never smoked. She has never used smokeless tobacco. She reports that she does not drink alcohol or use illicit drugs.  She is not married.  She has one teenage daughter.  Allergies  Allergen Reactions  . Sulfamethoxazole-Trimethoprim Rash    Family History  Problem Relation Age of Onset  . Thyroid disease Mother   . Thyroid  disease Sister   . Asthma Sister   . Thyroid disease Maternal Grandmother   . Emphysema Maternal Grandmother   . Liver disease Maternal Aunt   . Alcohol abuse Maternal Grandfather     Prior to Admission medications   Medication Sig Start Date End Date Taking? Authorizing Provider  levothyroxine (SYNTHROID, LEVOTHROID) 50 MCG tablet Take 50 mcg by mouth daily before breakfast. 11/20/15  Yes Historical Provider, MD  ondansetron (ZOFRAN) 4 MG tablet Take 4 mg by mouth every 4 (four) hours as needed for nausea or vomiting.  09/14/15  Yes Historical Provider, MD  PRESCRIPTION MEDICATION Take 1 tablet by mouth 2 (two) times daily. Unknown chemo drug from [place] Provider, MD    Physical Exam: Filed Vitals:   11/25/15 0100 11/25/15 0130 11/25/15 0200 11/25/15 0230  BP: 101/71 115/80 103/73 109/77  Pulse: 103 106 101 116  Temp:      TempSrc:      Resp: 19 26 24 17   Height:      Weight:      SpO2: 97% 99% 99% 99%      Constitutional: NAD, calm, comfortable Filed Vitals:   11/25/15 0100 11/25/15 0130 11/25/15 0200 11/25/15 0230  BP: 101/71 115/80 103/73 109/77  Pulse: 103 106 101 116  Temp:      TempSrc:      Resp: 19 26 24 17   Height:      Weight:      SpO2: 97%  99% 99% 99%   Eyes: PERRL, lids and conjunctivae normal ENMT: Mucous membranes are dry. Posterior pharynx clear of any exudate or lesions.Normal dentition.  Neck: normal, supple Respiratory: clear to auscultation bilaterally, no wheezing, no crackles. Normal respiratory effort. No accessory muscle use.  Cardiovascular: Regular rate and rhythm, no murmurs / rubs / gallops. No extremity edema. 2+ pedal pulses. Abdomen: Mild tenderness, no guarding, bowel sounds are present Musculoskeletal: no clubbing / cyanosis. No joint deformity upper and lower extremities. Good ROM, no contractures. Normal muscle tone.  Skin: no rashes, warm and dry Neurologic: CN 2-12 grossly intact. Sensation intact, Strength 5/5 in all  4.  Psychiatric: Normal judgment and insight. Alert and oriented x 3. Normal mood.    Labs on Admission: I have personally reviewed following labs and imaging studies  CBC:  Recent Labs Lab 11/24/15 2130  WBC 5.0  NEUTROABS 3.2  HGB 13.1  HCT 38.3  MCV 85.9  PLT [id number]   Basic Metabolic Panel:  Recent Labs Lab 11/24/15 2130  NA 136  K 2.8*  CL 104  CO2 23  GLUCOSE 138*  BUN 12  CREATININE 0.99  CALCIUM 8.4*   GFR: Estimated Creatinine Clearance: 75.8 mL/min (by C-G formula based on Cr of 0.99). Liver Function Tests:  Recent Labs Lab 11/24/15 2130  AST 29  ALT 25  ALKPHOS 102  BILITOT 0.9  PROT 6.0*  ALBUMIN 3.1*    Recent Labs Lab 11/24/15 2130  LIPASE 32   Sepsis Labs:  Lactic acid 1.5  Radiological Exams on Admission: Ct Abdomen Pelvis W Contrast  11/25/2015  CLINICAL DATA:  45 year old female with abdominal pain and diarrhea x2 weeks. EXAM: CT ABDOMEN AND PELVIS WITH CONTRAST TECHNIQUE: Multidetector CT imaging of the abdomen and pelvis was performed using the standard protocol following bolus administration of intravenous contrast. CONTRAST:  141mL ISOVUE-300 IOPAMIDOL (ISOVUE-300) INJECTION 61% COMPARISON:  None. FINDINGS: The visualized lungs are clear. There is no cardiomegaly or pericardial effusion. No intra-abdominal free air or free fluid. Diffuse fatty infiltration of the liver. Cholecystectomy. The pancreas, spleen, adrenal glands, kidneys, visualized ureters, and urinary bladder appear unremarkable. The the uterus is anteverted and grossly unremarkable. Set multiple thickened and inflamed loops of small bowel noted throughout the abdomen. There is diffuse enhancement of the colonic mucosa. Loose stool noted throughout the colon compatible with diarrheal state. Findings are most compatible with an enterocolitis, likely of an infectious etiology. Correlation with clinical exam and stool cultures recommended. Underlying inflammatory bowel disease is not  excluded. There is no evidence of bowel obstruction. Normal appendix. The abdominal aorta and IVC appear unremarkable. No portal venous gas identified. There is no adenopathy. There are bilateral mastectomy changes. The chest and abdominal wall soft tissues are otherwise unremarkable. There bilateral L5 pars defects with grade 1 L5-S1 anterolisthesis. No acute osseous pathology. IMPRESSION: Intra colitis, likely on an infectious etiology. Underlying inflammatory bowel disease is not excluded. Correlation with clinical exam and stool cultures recommended. No bowel obstruction. Normal appendix. Electronically Signed   By: Anner Crete M.D.   On: 11/25/2015 00:40    EKG: Independently reviewed. Sinus tachycardia.  No acute ST segment changes.  Assessment/Plan Principal Problem:   Enterocolitis Active Problems:   Hypokalemia   Diarrhea   Dehydration   Sinus tachycardia (HCC)   Acute enterocolitis, etiology unclear.  She does not appear to be septic. --Empiric IV levaquin and flagyl --Will need oral vancomycin if C diff is positive --GI pathogens panel, including C diff  PCR, pending --Will need GI consult in the AM --Enteric precautions for now --Clear liquid diet as tolerated --Anti-emetics and analgesics as needed  Sinus tachycardia secondary to dehydration --Expect improvement in heart rate with hydration  Hypokalemia secondary to subacute GI losses (diarrhea x one month, then N/V) --Oral and IV replacement ordered in the ED --NS with 59mEq of KCl at 100cc/hr --Follow-up repeat BMP  Hypothyroidism --Continue home dose of synthroid  Breast cancer --Oral chemo agent discontinued on Friday; still undergoing radiation therapy   DVT prophylaxis: Lovenox Code Status: FULL Family Communication: Patient alone Disposition Plan: Home when ready Consults called: NONE Admission status: Observation telemetry   Eber Jones MD [facility]  If 7PM-7AM, please  contact night-coverage [email or web address] Password [email or web address]  11/25/2015, 2:51 AM

## 2015-11-26 ENCOUNTER — Encounter (HOSPITAL_COMMUNITY): Payer: Self-pay | Admitting: Internal Medicine

## 2015-11-26 DIAGNOSIS — K529 Noninfective gastroenteritis and colitis, unspecified: Principal | ICD-10-CM

## 2015-11-26 DIAGNOSIS — Z9049 Acquired absence of other specified parts of digestive tract: Secondary | ICD-10-CM | POA: Diagnosis not present

## 2015-11-26 DIAGNOSIS — T451X5A Adverse effect of antineoplastic and immunosuppressive drugs, initial encounter: Secondary | ICD-10-CM | POA: Diagnosis present

## 2015-11-26 DIAGNOSIS — Z923 Personal history of irradiation: Secondary | ICD-10-CM | POA: Diagnosis not present

## 2015-11-26 DIAGNOSIS — R000 Tachycardia, unspecified: Secondary | ICD-10-CM | POA: Diagnosis present

## 2015-11-26 DIAGNOSIS — E860 Dehydration: Secondary | ICD-10-CM | POA: Diagnosis present

## 2015-11-26 DIAGNOSIS — Z79899 Other long term (current) drug therapy: Secondary | ICD-10-CM | POA: Diagnosis not present

## 2015-11-26 DIAGNOSIS — E039 Hypothyroidism, unspecified: Secondary | ICD-10-CM | POA: Diagnosis present

## 2015-11-26 DIAGNOSIS — E870 Hyperosmolality and hypernatremia: Secondary | ICD-10-CM | POA: Diagnosis present

## 2015-11-26 DIAGNOSIS — M797 Fibromyalgia: Secondary | ICD-10-CM | POA: Diagnosis present

## 2015-11-26 DIAGNOSIS — Z9221 Personal history of antineoplastic chemotherapy: Secondary | ICD-10-CM | POA: Diagnosis not present

## 2015-11-26 DIAGNOSIS — Z888 Allergy status to other drugs, medicaments and biological substances status: Secondary | ICD-10-CM | POA: Diagnosis not present

## 2015-11-26 DIAGNOSIS — C50919 Malignant neoplasm of unspecified site of unspecified female breast: Secondary | ICD-10-CM | POA: Diagnosis present

## 2015-11-26 DIAGNOSIS — K6389 Other specified diseases of intestine: Secondary | ICD-10-CM | POA: Diagnosis not present

## 2015-11-26 DIAGNOSIS — Z22322 Carrier or suspected carrier of Methicillin resistant Staphylococcus aureus: Secondary | ICD-10-CM | POA: Diagnosis not present

## 2015-11-26 DIAGNOSIS — E878 Other disorders of electrolyte and fluid balance, not elsewhere classified: Secondary | ICD-10-CM | POA: Diagnosis present

## 2015-11-26 DIAGNOSIS — R197 Diarrhea, unspecified: Secondary | ICD-10-CM

## 2015-11-26 DIAGNOSIS — E876 Hypokalemia: Secondary | ICD-10-CM | POA: Diagnosis present

## 2015-11-26 LAB — CBC
HEMATOCRIT: 30.8 % — AB (ref 36.0–46.0)
HEMOGLOBIN: 10.2 g/dL — AB (ref 12.0–15.0)
MCH: 29.1 pg (ref 26.0–34.0)
MCHC: 33.1 g/dL (ref 30.0–36.0)
MCV: 88 fL (ref 78.0–100.0)
PLATELETS: 187 10*3/uL (ref 150–400)
RBC: 3.5 MIL/uL — ABNORMAL LOW (ref 3.87–5.11)
RDW: 19.5 % — ABNORMAL HIGH (ref 11.5–15.5)
WBC: 4.3 10*3/uL (ref 4.0–10.5)

## 2015-11-26 LAB — BASIC METABOLIC PANEL
ANION GAP: 7 (ref 5–15)
BUN: 7 mg/dL (ref 6–20)
CO2: 21 mmol/L — ABNORMAL LOW (ref 22–32)
Calcium: 8 mg/dL — ABNORMAL LOW (ref 8.9–10.3)
Chloride: 111 mmol/L (ref 101–111)
Creatinine, Ser: 0.79 mg/dL (ref 0.44–1.00)
Glucose, Bld: 99 mg/dL (ref 65–99)
POTASSIUM: 3.1 mmol/L — AB (ref 3.5–5.1)
SODIUM: 139 mmol/L (ref 135–145)

## 2015-11-26 MED ORDER — POTASSIUM CHLORIDE CRYS ER 20 MEQ PO TBCR
20.0000 meq | EXTENDED_RELEASE_TABLET | Freq: Two times a day (BID) | ORAL | Status: DC
  Administered 2015-11-26 – 2015-11-27 (×2): 20 meq via ORAL
  Filled 2015-11-26 (×2): qty 1

## 2015-11-26 MED ORDER — METOCLOPRAMIDE HCL 5 MG/ML IJ SOLN
10.0000 mg | Freq: Once | INTRAMUSCULAR | Status: AC
  Administered 2015-11-26: 10 mg via INTRAVENOUS
  Filled 2015-11-26: qty 2

## 2015-11-26 MED ORDER — METOCLOPRAMIDE HCL 5 MG/ML IJ SOLN
10.0000 mg | Freq: Once | INTRAMUSCULAR | Status: AC
  Administered 2015-11-27: 10 mg via INTRAVENOUS
  Filled 2015-11-26: qty 2

## 2015-11-26 MED ORDER — PEG-KCL-NACL-NASULF-NA ASC-C 100 G PO SOLR: 1.0000 | Freq: Once | ORAL | Status: DC

## 2015-11-26 MED ORDER — POTASSIUM CHLORIDE CRYS ER 20 MEQ PO TBCR
60.0000 meq | EXTENDED_RELEASE_TABLET | Freq: Two times a day (BID) | ORAL | Status: DC
  Administered 2015-11-26: 60 meq via ORAL
  Filled 2015-11-26: qty 3

## 2015-11-26 MED ORDER — RADIAPLEXRX EX GEL: CUTANEOUS | Status: DC | PRN

## 2015-11-26 MED ORDER — RADIAPLEXRX EX GEL
Freq: Every day | CUTANEOUS | Status: DC
  Administered 2015-11-26: 15:00:00 via TOPICAL
  Administered 2015-11-27: 1 via TOPICAL
  Administered 2015-11-28: 08:00:00 via TOPICAL

## 2015-11-26 MED ORDER — PEG-KCL-NACL-NASULF-NA ASC-C 100 G PO SOLR
0.5000 | Freq: Once | ORAL | Status: AC
  Administered 2015-11-27: 100 g via ORAL
  Filled 2015-11-26: qty 1

## 2015-11-26 MED ORDER — PEG-KCL-NACL-NASULF-NA ASC-C 100 G PO SOLR
0.5000 | Freq: Once | ORAL | Status: AC
  Administered 2015-11-26: 100 g via ORAL
  Filled 2015-11-26: qty 1

## 2015-11-26 NOTE — Progress Notes (Signed)
PROGRESS NOTE    Kathryn Skinner  [id number] DOB: 06/16/1971 DOA: 11/24/2015 PCP: No PCP Per Patient   Outpatient Specialists:     Brief Narrative:  Kathryn Skinner is a 45 y.o. woman with a history of breast cancer who is actively undergoing radiation treatments and was started on an oral chemotherapy agent approximately one month ago. Since that time, she has had nonbloody diarrhea associated with diffuse abdominal pain. In the past two days, she has developed significant nausea and vomiting (nonbloody emesis) associated with the inability to tolerate PO intake. No documented fever. She has been light-headed, but no syncope. Her abdominal pain "feels like knots that are being pulled", waxing and waning in intensity (10 out of 10 at its worst). She has only tried OTC imodium for her symptoms (under the direction of her physician), but it has not helped with her symptoms.     Assessment & Plan:   Principal Problem:   Enterocolitis Active Problems:   Hypokalemia   Diarrhea   Dehydration   Sinus tachycardia (HCC)  Acute enterocolitis, etiology unclear. She does not appear to be septic. --Empiric IV levaquin and flagyl --c diff and GI panel negative -soft diet --Anti-emetics and analgesics as needed Per up-to-date regarding Xeloda: Gastrointestinal toxicity: May cause diarrhea (may be severe); median time to first occurrence of grade 2 to 4 diarrhea was 34 days and median duration of grades 3 or 4 diarrhea was 5 days. Withhold treatment for grades 2 to 4 diarrhea; subsequent doses should be reduced after grade 3 or 4 diarrhea or recurrence of grade 2 diarrhea. Antidiarrheal therapy (eg, loperamide) is recommended. Dehydration may occur rapidly in patients with diarrhea, nausea, vomiting, anorexia, and/or weakness; adequately hydrate prior to treatment initiation. Elderly patients may be at higher risk for dehydration. Interrupt treatment for grade 2 or higher dehydration;  correct precipitating factors and ensure rehydration prior to resuming therapy; may require dose modification (based on precipitating factor). Necrotizing enterocolitis (typhlitis) has been reported. -has not taken since Thursday  Sinus tachycardia secondary to dehydration --Expect improvement in heart rate with hydration  Hypokalemia secondary to subacute GI losses (diarrhea x one month, then N/V) --Oral and IV replacement ordered in the ED --NS with 72mEq of KCl at 100cc/hr --Follow-up repeat BMP  Hypothyroidism --Continue home dose of synthroid  Breast cancer --Oral chemo agent discontinued on thursday; still undergoing radiation therapy   DVT prophylaxis:  Lovenox   Code Status: Full Code   Family Communication: Patient and significant other at bedside  Disposition Plan:    Consultants:   GI- LB  Procedures:      Subjective: Still with diarrhea-- afraid to eat as it worsens -described as water  Objective: Filed Vitals:   11/25/15 0407 11/25/15 1424 11/25/15 2055 11/26/15 0402  BP: 105/69 108/60 103/55 90/53  Pulse: 108 116 101 101  Temp: 98.2 F (36.8 C) 98.3 F (36.8 C) 98.4 F (36.9 C) 98.4 F (36.9 C)  TempSrc:  Oral Oral Oral  Resp: 18 18 18 18   Height: 5\' 6"  (1.676 m)     Weight: 79.243 kg (174 lb 11.2 oz)     SpO2: 99% 98% 100% 99%    Intake/Output Summary (Last 24 hours) at 11/26/15 1225 Last data filed at 11/26/15 0926  Gross per 24 hour  Intake   2460 ml  Output      0 ml  Net   2460 ml   Filed Weights   11/24/15 2123 11/25/15 0407  Weight: 78.155 kg (172 lb 4.8 oz) 79.243 kg (174 lb 11.2 oz)    Examination:  General exam: Appears calm and comfortable  Respiratory system: Clear to auscultation. Respiratory effort normal. Cardiovascular system: S1 & S2 heard, RRR. No JVD, murmurs, rubs, gallops or clicks. No pedal edema. Gastrointestinal system: Abdomen is nondistended, soft and nontender. No organomegaly or masses felt.  Normal bowel sounds heard.     Data Reviewed: I have personally reviewed following labs and imaging studies  CBC:  Recent Labs Lab 11/24/15 2130 11/26/15 0655  WBC 5.0 4.3  NEUTROABS 3.2  --   HGB 13.1 10.2*  HCT 38.3 30.8*  MCV 85.9 88.0  PLT 235 [id number]   Basic Metabolic Panel:  Recent Labs Lab 11/24/15 2130 11/25/15 0821 11/26/15 0655  NA 136 135 139  K 2.8* 3.5 3.1*  CL 104 109 111  CO2 23 21* 21*  GLUCOSE 138* 101* 99  BUN 12 12 7   CREATININE 0.99 0.76 0.79  CALCIUM 8.4* 7.5* 8.0*  MG 1.5*  --   --    GFR: Estimated Creatinine Clearance: 94.3 mL/min (by C-G formula based on Cr of 0.79). Liver Function Tests:  Recent Labs Lab 11/24/15 2130  AST 29  ALT 25  ALKPHOS 102  BILITOT 0.9  PROT 6.0*  ALBUMIN 3.1*    Recent Labs Lab 11/24/15 2130  LIPASE 32   No results for input(s): AMMONIA in the last 168 hours. Coagulation Profile: No results for input(s): INR, PROTIME in the last 168 hours. Cardiac Enzymes: No results for input(s): CKTOTAL, CKMB, CKMBINDEX, TROPONINI in the last 168 hours. BNP (last 3 results) No results for input(s): PROBNP in the last 8760 hours. HbA1C: No results for input(s): HGBA1C in the last 72 hours. CBG: No results for input(s): GLUCAP in the last 168 hours. Lipid Profile: No results for input(s): CHOL, HDL, LDLCALC, TRIG, CHOLHDL, LDLDIRECT in the last 72 hours. Thyroid Function Tests: No results for input(s): TSH, T4TOTAL, FREET4, T3FREE, THYROIDAB in the last 72 hours. Anemia Panel: No results for input(s): VITAMINB12, FOLATE, FERRITIN, TIBC, IRON, RETICCTPCT in the last 72 hours. Urine analysis: No results found for: COLORURINE, APPEARANCEUR, LABSPEC, PHURINE, GLUCOSEU, HGBUR, BILIRUBINUR, KETONESUR, PROTEINUR, UROBILINOGEN, NITRITE, LEUKOCYTESUR   ) Recent Results (from the past 240 hour(s))  Gastrointestinal Panel by PCR , Stool     Status: None   Collection Time: 11/25/15  2:06 AM  Result Value Ref Range  Status   Campylobacter species NOT DETECTED NOT DETECTED Final   Plesimonas shigelloides NOT DETECTED NOT DETECTED Final   Salmonella species NOT DETECTED NOT DETECTED Final   Yersinia enterocolitica NOT DETECTED NOT DETECTED Final   Vibrio species NOT DETECTED NOT DETECTED Final   Vibrio cholerae NOT DETECTED NOT DETECTED Final   Enteroaggregative E coli (EAEC) NOT DETECTED NOT DETECTED Final   Enteropathogenic E coli (EPEC) NOT DETECTED NOT DETECTED Final   Enterotoxigenic E coli (ETEC) NOT DETECTED NOT DETECTED Final   Shiga like toxin producing E coli (STEC) NOT DETECTED NOT DETECTED Final   E. coli O157 NOT DETECTED NOT DETECTED Final   Shigella/Enteroinvasive E coli (EIEC) NOT DETECTED NOT DETECTED Final   Cryptosporidium NOT DETECTED NOT DETECTED Final   Cyclospora cayetanensis NOT DETECTED NOT DETECTED Final   Entamoeba histolytica NOT DETECTED NOT DETECTED Final   Giardia lamblia NOT DETECTED NOT DETECTED Final   Adenovirus F40/41 NOT DETECTED NOT DETECTED Final   Astrovirus NOT DETECTED NOT DETECTED Final   Norovirus GI/GII NOT DETECTED  NOT DETECTED Final   Rotavirus A NOT DETECTED NOT DETECTED Final   Sapovirus (I, II, IV, and V) NOT DETECTED NOT DETECTED Final  C difficile quick scan w PCR reflex     Status: None   Collection Time: 11/25/15  2:06 AM  Result Value Ref Range Status   C Diff antigen NEGATIVE NEGATIVE Final   C Diff toxin NEGATIVE NEGATIVE Final   C Diff interpretation Negative for toxigenic C. difficile  Final      Anti-infectives    Start     Dose/Rate Route Frequency Ordered Stop   11/25/15 0930  levofloxacin (LEVAQUIN) IVPB 750 mg     750 mg 100 mL/hr over 90 Minutes Intravenous Every 24 hours 11/25/15 0843     11/25/15 0830  metroNIDAZOLE (FLAGYL) IVPB 500 mg     500 mg 100 mL/hr over 60 Minutes Intravenous Every 8 hours 11/25/15 0807     11/25/15 0200  ciprofloxacin (CIPRO) tablet 500 mg     500 mg Oral  Once 11/25/15 0152 11/25/15 0212    11/25/15 0200  metroNIDAZOLE (FLAGYL) tablet 500 mg     500 mg Oral  Once 11/25/15 0152 11/25/15 [id number]       Radiology Studies: Ct Abdomen Pelvis W Contrast  11/25/2015  CLINICAL DATA:  45 year old female with abdominal pain and diarrhea x2 weeks. EXAM: CT ABDOMEN AND PELVIS WITH CONTRAST TECHNIQUE: Multidetector CT imaging of the abdomen and pelvis was performed using the standard protocol following bolus administration of intravenous contrast. CONTRAST:  19mL ISOVUE-300 IOPAMIDOL (ISOVUE-300) INJECTION 61% COMPARISON:  None. FINDINGS: The visualized lungs are clear. There is no cardiomegaly or pericardial effusion. No intra-abdominal free air or free fluid. Diffuse fatty infiltration of the liver. Cholecystectomy. The pancreas, spleen, adrenal glands, kidneys, visualized ureters, and urinary bladder appear unremarkable. The the uterus is anteverted and grossly unremarkable. Set multiple thickened and inflamed loops of small bowel noted throughout the abdomen. There is diffuse enhancement of the colonic mucosa. Loose stool noted throughout the colon compatible with diarrheal state. Findings are most compatible with an enterocolitis, likely of an infectious etiology. Correlation with clinical exam and stool cultures recommended. Underlying inflammatory bowel disease is not excluded. There is no evidence of bowel obstruction. Normal appendix. The abdominal aorta and IVC appear unremarkable. No portal venous gas identified. There is no adenopathy. There are bilateral mastectomy changes. The chest and abdominal wall soft tissues are otherwise unremarkable. There bilateral L5 pars defects with grade 1 L5-S1 anterolisthesis. No acute osseous pathology. IMPRESSION: Intra colitis, likely on an infectious etiology. Underlying inflammatory bowel disease is not excluded. Correlation with clinical exam and stool cultures recommended. No bowel obstruction. Normal appendix. Electronically Signed   By: Anner Crete  M.D.   On: 11/25/2015 00:40        Scheduled Meds: . dicyclomine  20 mg Oral TID AC & HS  . enoxaparin (LOVENOX) injection  40 mg Subcutaneous Q24H  . levofloxacin (LEVAQUIN) IV  750 mg Intravenous Q24H  . levothyroxine  50 mcg Oral QAC breakfast  . metronidazole  500 mg Intravenous Q8H  . potassium chloride  20 mEq Oral BID  . sodium chloride flush  3 mL Intravenous Q12H   Continuous Infusions: . 0.9 % NaCl with KCl 40 mEq / L 125 mL/hr (11/26/15 0758)        Time spent: 25 min    [place], DO [facility] Pager [phone]  If 7PM-7AM, please contact night-coverage [email or web address] Password [facility] 11/26/2015,  12:25 PM

## 2015-11-26 NOTE — Care Management Note (Addendum)
Case Management Note  Patient Details  Name: Kathryn Skinner MRN: [id number] Date of Birth: [birth year]  Subjective/Objective:     Presents with Enterocolitis. Hx of breast cancer who is actively undergoing radiation treatments and was started on an oral chemotherapy agent approximately one month ago. Since that time, she has had nonbloody diarrhea associated with diffuse abdominal pain. Resides with daughter. Independent with ADL's PTA. No DME usage. PCP: [facility].   Action/Plan: Return to home when medically stable. CM to f/u with disposition needs.  Expected Discharge Date:  11/28/15               Expected Discharge Plan:  Home/Self Care  In-House Referral:     Discharge planning Services  CM Consult  Post Acute Care Choice:    Choice offered to:     DME Arranged:    DME Agency:     HH Arranged:    HH Agency:     Status of Service:  In process, will continue to follow  Medicare Important Message Given:    Date Medicare IM Given:    Medicare IM give by:    Date Additional Medicare IM Given:    Additional Medicare Important Message give by:     If discussed at [place] of Stay Meetings, dates discussed:    Additional Comments:  Sharin Mons, [place] [phone] 11/26/2015, 12:42 PM

## 2015-11-26 NOTE — Consult Note (Signed)
Consultation  Referring Provider:   Dr. Eliseo Skinner   Primary Care Physician:  No PCP Per Patient Primary Gastroenterologist:    None      Reason for Consultation:  Diarrhea, Abnormal CT      Impression / Plan:  Impression: 1. Diarrhea: 2 wk h/o 4-5 watery BM per day, did start Xeloda chemotherapy one mos ago, this was dc'd on Friday with no change in symptoms; Cdiff and gastro panel neg; pt started IV Levaquin and flagyl 11/25/15-Consider side effect vs microscopic colitis vs other 2. Abdominal Pain: LLQ and suprapubic worse after BM, likely related to above and known enterocolitis 3. Abnormal CT Abd/Pelvis: As above-multiple thickened and inflamed loops of small bowel noted throughout the abdomen. There was diffuse enhancement of the colonic mucosa. Loose stool was noted throughout the colon compatible with diarrheal state. This iwas thought most compatible with an entero-colitis, likely of infectious etiology. Inflammatory bowel disease could not be excluded. There was no evidence of bowel obstruction. Consider med side effect vs microscopic colitis vs viral vs infectious source 4. Breast cancer: dx Aug 2016;Currently undergoing chemo/XRT-see above 5. Hypokalemia: thought due to diarrhea, resolving  Plan: 1. Continue Levaquin and Flagyl for now 2. Recommend further evaluation via colonoscopy tomorrow, did discuss risks, benefits and limitations with the pt at time of exam and she agrees to proceed. Moviprep ordered. Clears today and NPO after midnight. 3. Will switch to Lomotil after colo tomorrow 4. Continue supportive measures 5. Will discuss above with Dr. Carlean Skinner, please await any further recs  Thank you for your kind consultation, we will continue to follow.  Kathryn Skinner  11/26/2015, 2:10 PM     [facility] Attending   I have taken an interval history, reviewed the chart and examined the patient. I agree with the Advanced Practitioner's note, impression and recommendations.    Diarrhea and enterocolitis likely from Xeloda but I think a colonoscopy to evaluate for IBD reasonable  The risks and benefits as well as alternatives of endoscopic procedure(s) have been discussed and reviewed. All questions answered. The patient agrees to proceed.  I have also personally reviewed images of CT scan. [facility] records reviewed by care everywhere.  Kathryn Mayer, MD, [facility] [phone] (pager) [phone] after 5 PM, weekends and holidays  11/26/2015 4:09 PM          HPI:   Kathryn Skinner is a 45 y.o. Caucasian female with a history of breast cancer who is actively undergoing radiation treatments and was started on oral chemotherapy agent, Xeloda, 1 month ago, who presented to the hospital on 11/25/2015 with a complaint of abdominal pain, nausea, vomiting and diarrhea.  Today, the patient tells me that she was diagnosed with breast cancer in August 2016 and started chemotherapy treatments 6 months ago. She also had her last radiation treatment one month ago. She explains that she was started on a new chemotherapy, Xeloda, approximately 1 month ago and 2 weeks ago started with 4-5 watery urgent bowel movements per day. These were accompanied by a suprapubic/left lower quadrant abdominal pain that feels like a "knot is pulling tighter and tighter". She describes that at its worse this pain as a 10 /10 and makes her curl up in a ball, this is made worse after a bowel movement. Only taking a hot bath or placing a hot rag on this area makes it feel any better. She tells me that the intensity "comes and goes". Patient describes having  to have a bowel movement after anything that she eats or drinks. She has experienced a decrease in appetite over the past 2-3 days, but was able to eat her lunch for the first time today. Patient does have occasional nausea which persists. She explains that on Sunday she vomited twice, but since then has had no further emesis. The  patient did try Imodium at home, 2 tabs after her first loose bowel movement and then 1 after every other loose bowel movement. She tells me she was taking 1 tablet every 4-6 hours with no change. Her last bowel movement was at 11:30 AM today. Associated symptoms include some dizziness and occasional chills.  Patient denies previous endoscopic workup including colonoscopy or EGD, dysphagia, heartburn, reflux, hematemesis, hematochezia, melena, use of NSAIDs, use of alcohol or tobacco or other new medications.  Past Medical History  Diagnosis Date  . Eye disease   . Fibromyalgia   . Cancer [facility])     Past Surgical History  Procedure Laterality Date  . Cholecystectomy      Family History  Problem Relation Age of Onset  . Thyroid disease Mother   . Thyroid disease Sister   . Asthma Sister   . Thyroid disease Maternal Grandmother   . Emphysema Maternal Grandmother   . Liver disease Maternal Aunt   . Alcohol abuse Maternal Grandfather   Patient admits to history of pancreatic cancer in her paternal grandfather and fatty liver in her mother; denies family history of irritable bowel disease, breast cancer, ovarian cancer, colon cancer or polyps.  Social History  Substance Use Topics  . Smoking status: Never Smoker   . Smokeless tobacco: Never Used  . Alcohol Use: No    Prior to Admission medications   Medication Sig Start Date End Date Taking? Authorizing Provider  levothyroxine (SYNTHROID, LEVOTHROID) 50 MCG tablet Take 50 mcg by mouth daily before breakfast. 11/20/15  Yes Historical Provider, MD  ondansetron (ZOFRAN) 4 MG tablet Take 4 mg by mouth every 4 (four) hours as needed for nausea or vomiting.  09/14/15  Yes Historical Provider, MD  PRESCRIPTION MEDICATION Take 1 tablet by mouth 2 (two) times daily. Unknown chemo drug from [place] Provider, MD    Current Facility-Administered Medications  Medication Dose Route Frequency Provider Last Rate Last Dose  . 0.9 %  NaCl with KCl 40 mEq / L  infusion   Intravenous Continuous Kathryn Girt, DO 125 mL/hr at 11/26/15 0758 125 mL/hr at 11/26/15 0758  . acetaminophen (TYLENOL) tablet 650 mg  650 mg Oral Q6H PRN Lily Kocher, MD       Or  . acetaminophen (TYLENOL) suppository 650 mg  650 mg Rectal Q6H PRN Lily Kocher, MD      . dicyclomine (BENTYL) tablet 20 mg  20 mg Oral TID AC & HS Jessica U Vann, DO   20 mg at 11/26/15 1120  . enoxaparin (LOVENOX) injection 40 mg  40 mg Subcutaneous Q24H Lily Kocher, MD   40 mg at 11/26/15 [id number]  . levofloxacin (LEVAQUIN) IVPB 750 mg  750 mg Intravenous Q24H Gaynell Face Hiatt, RPH   750 mg at 11/26/15 [id number]  . levothyroxine (SYNTHROID, LEVOTHROID) tablet 50 mcg  50 mcg Oral QAC breakfast Lily Kocher, MD   50 mcg at 11/26/15 0721  . loperamide (IMODIUM) capsule 2 mg  2 mg Oral PRN Kathryn Girt, DO   2 mg at 11/26/15 0630  . metroNIDAZOLE (FLAGYL) IVPB 500 mg  500  mg Intravenous Q8H Lily Kocher, MD   500 mg at 11/26/15 0721  . morphine 2 MG/ML injection 2 mg  2 mg Intravenous Q4H PRN Lily Kocher, MD   2 mg at 11/26/15 1231  . oxyCODONE (Oxy IR/ROXICODONE) immediate release tablet 5 mg  5 mg Oral Q4H PRN Lily Kocher, MD   5 mg at 11/26/15 1120  . potassium chloride SA (K-DUR,KLOR-CON) CR tablet 20 mEq  20 mEq Oral BID Kathryn Girt, DO      . promethazine (PHENERGAN) injection 6.25 mg  6.25 mg Intravenous Q4H PRN Lily Kocher, MD   6.25 mg at 11/26/15 1229  . sodium chloride flush (NS) 0.9 % injection 3 mL  3 mL Intravenous Q12H Lily Kocher, MD   3 mL at 11/25/15 1000    Allergies as of 11/24/2015 - Review Complete 11/24/2015  Allergen Reaction Noted  . Sulfamethoxazole-trimethoprim Rash 03/04/2007     Review of Systems:    Constitutional: Positive for chills No weight loss, fever, weakness or fatigue HEENT: Eyes: No visual loss, blurred vision, double vision or yellow sclerae               Ears, Nose, Throat:  No hearing loss, sneezing. Congestion, runny nose or sore  throat Skin: Positive erythema on right arm near iv site No rash or itching Cardiovascular: No chest pain, chest pressure or chest discomfort. No palpitations or edema Respiratory: No SOB, cough or sputum Gastrointestinal: See HPI and otherwise negative Genitourinary: No dysuria or change in urinary frequency Neurological: Positive for dizziness No headache, syncope, numbness or tingling in the extremities Musculoskeletal: No muscle, back pain, joint pain or stiffness. Hematologic: No anemia, bleeding or bruising Lymphatics: No history of splenectomy Psychiatric: No history of depression or anxiety Endocrinologic: No reports of sweating, cold or heat intolerance, poyluria or polydypsia Allergies: No history of asthma, hives or eczema    Physical Exam:  Vital signs in last 24 hours: Temp:  [98.1 F (36.7 C)-98.4 F (36.9 C)] 98.1 F (36.7 C) (06/06 1344) Pulse Rate:  [97-116] 97 (06/06 1344) Resp:  [17-18] 17 (06/06 1344) BP: (90-108)/(53-61) 102/61 mmHg (06/06 1344) SpO2:  [98 %-100 %] 99 % (06/06 0402) Last BM Date: 11/25/15 General:   Pleasant Caucasian female appears to be in NAD, Well developed, Well nourished, alert and cooperative Head:  Normocephalic and atraumatic. Eyes:   PEERL, EOMI. No icterus. Conjunctiva pink. Ears:  Normal auditory acuity. Neck:  Supple Throat: Oral cavity and pharynx without inflammation, swelling or lesion. Teeth in good condition. Lungs: Respirations even and unlabored. Lungs clear to auscultation bilaterally.   No wheezes, crackles, or rhonchi.  Heart: Normal S1, S2. No MRG. Regular rate and rhythm. No peripheral edema Abdomen:  Soft, nondistended, Generalized mild ttp all 4 quadrants, worse in LLQ/suprapubic area, No rebound or guarding. Normal bowel sounds. No appreciable masses or hepatomegaly. No abdominal distension.  Rectal:  Not performed.  Msk:  Symmetrical without gross deformities. Peripheral pulses intact.  Extremities:  Without  edema, no deformity or joint abnormality. Normal ROM Neurologic:  Alert and  oriented x4;  grossly normal neurologically.  Skin:   Dry and intact without significant lesions or rashes. Psychiatric: Oriented to person, place and time. Demonstrates good judgement and reason without abnormal affect or behaviors.   LAB RESULTS:  Recent Labs  11/24/15 2130 11/26/15 0655  WBC 5.0 4.3  HGB 13.1 10.2*  HCT 38.3 30.8*  PLT 235 187   Results for MUSHKA, MOLITORIS (MRN  [id number]) as of 11/26/2015 14:12  Ref. Range 11/26/2015 06:55  MCV Latest Ref Range: 78.0-100.0 fL 88.0  MCH Latest Ref Range: 26.0-34.0 pg 29.1  MCHC Latest Ref Range: 30.0-36.0 g/dL 33.1  RDW Latest Ref Range: 11.5-15.5 % 19.5 (H)    BMET  Recent Labs  11/24/15 2130 11/25/15 0821 11/26/15 0655  NA 136 135 139  K 2.8* 3.5 3.1*  CL 104 109 111  CO2 23 21* 21*  GLUCOSE 138* 101* 99  BUN 12 12 7   CREATININE 0.99 0.76 0.79  CALCIUM 8.4* 7.5* 8.0*   LFT  Recent Labs  11/24/15 2130  PROT 6.0*  ALBUMIN 3.1*  AST 29  ALT 25  ALKPHOS 102  BILITOT 0.9    Stool Studies: C-Diff: Neg    Ref Range 1d ago    Campylobacter species NOT DETECTED  NOT DETECTED   Plesimonas shigelloides NOT DETECTED  NOT DETECTED   Salmonella species NOT DETECTED  NOT DETECTED   Yersinia enterocolitica NOT DETECTED  NOT DETECTED   Vibrio species NOT DETECTED  NOT DETECTED   Vibrio cholerae NOT DETECTED  NOT DETECTED   Enteroaggregative E coli (EAEC) NOT DETECTED  NOT DETECTED   Enteropathogenic E coli (EPEC) NOT DETECTED  NOT DETECTED   Enterotoxigenic E coli (ETEC) NOT DETECTED  NOT DETECTED   Shiga like toxin producing E coli (STEC) NOT DETECTED  NOT DETECTED   E. coli O157 NOT DETECTED  NOT DETECTED   Shigella/Enteroinvasive E coli (EIEC) NOT DETECTED  NOT DETECTED   Cryptosporidium NOT DETECTED  NOT DETECTED   Cyclospora cayetanensis NOT DETECTED  NOT DETECTED   Entamoeba histolytica NOT DETECTED  NOT  DETECTED   Giardia lamblia NOT DETECTED  NOT DETECTED   Adenovirus F40/41 NOT DETECTED  NOT DETECTED   Astrovirus NOT DETECTED  NOT DETECTED   Norovirus GI/GII NOT DETECTED  NOT DETECTED   Rotavirus A NOT DETECTED  NOT DETECTED   Sapovirus (I, II, IV, and V) NOT DETECTED  NOT DETECTED   Resulting Agency [facility]       Specimen Collected: 11/25/15 2:06 AM Last Resulted: 11/25/15 2:54 PM          STUDIES: Ct Abdomen Pelvis W Contrast  11/25/2015  CLINICAL DATA:  45 year old female with abdominal pain and diarrhea x2 weeks. EXAM: CT ABDOMEN AND PELVIS WITH CONTRAST TECHNIQUE: Multidetector CT imaging of the abdomen and pelvis was performed using the standard protocol following bolus administration of intravenous contrast. CONTRAST:  143mL ISOVUE-300 IOPAMIDOL (ISOVUE-300) INJECTION 61% COMPARISON:  None. FINDINGS: The visualized lungs are clear. There is no cardiomegaly or pericardial effusion. No intra-abdominal free air or free fluid. Diffuse fatty infiltration of the liver. Cholecystectomy. The pancreas, spleen, adrenal glands, kidneys, visualized ureters, and urinary bladder appear unremarkable. The the uterus is anteverted and grossly unremarkable. Set multiple thickened and inflamed loops of small bowel noted throughout the abdomen. There is diffuse enhancement of the colonic mucosa. Loose stool noted throughout the colon compatible with diarrheal state. Findings are most compatible with an enterocolitis, likely of an infectious etiology. Correlation with clinical exam and stool cultures recommended. Underlying inflammatory bowel disease is not excluded. There is no evidence of bowel obstruction. Normal appendix. The abdominal aorta and IVC appear unremarkable. No portal venous gas identified. There is no adenopathy. There are bilateral mastectomy changes. The chest and abdominal wall soft tissues are otherwise unremarkable. There bilateral L5 pars defects with grade 1 L5-S1  anterolisthesis. No acute osseous pathology. IMPRESSION: Intra colitis, likely  on an infectious etiology. Underlying inflammatory bowel disease is not excluded. Correlation with clinical exam and stool cultures recommended. No bowel obstruction. Normal appendix. Electronically Signed   By: Anner Crete M.D.   On: 11/25/2015 00:40     PREVIOUS ENDOSCOPIES:            No previous

## 2015-11-27 ENCOUNTER — Encounter (HOSPITAL_COMMUNITY)
Admission: EM | Disposition: A | Discharge status: Home / Self Care | Payer: Self-pay | Source: Home / Self Care | Attending: Internal Medicine

## 2015-11-27 ENCOUNTER — Encounter (HOSPITAL_COMMUNITY): Payer: Self-pay | Admitting: *Deleted

## 2015-11-27 DIAGNOSIS — K6389 Other specified diseases of intestine: Secondary | ICD-10-CM

## 2015-11-27 HISTORY — PX: COLONOSCOPY: SHX5424

## 2015-11-27 LAB — COMPREHENSIVE METABOLIC PANEL
ALK PHOS: 71 U/L (ref 38–126)
ALT: 18 U/L (ref 14–54)
AST: 18 U/L (ref 15–41)
Albumin: 2.4 g/dL — ABNORMAL LOW (ref 3.5–5.0)
Anion gap: 8 (ref 5–15)
CALCIUM: 8.6 mg/dL — AB (ref 8.9–10.3)
CHLORIDE: 116 mmol/L — AB (ref 101–111)
CO2: 21 mmol/L — AB (ref 22–32)
CREATININE: 0.73 mg/dL (ref 0.44–1.00)
GFR calc non Af Amer: >60 mL/min (ref >60)
Glucose, Bld: 96 mg/dL (ref 65–99)
Potassium: 3.9 mmol/L (ref 3.5–5.1)
SODIUM: 145 mmol/L (ref 135–145)
Total Bilirubin: 0.5 mg/dL (ref 0.3–1.2)
Total Protein: 5 g/dL — ABNORMAL LOW (ref 6.5–8.1)

## 2015-11-27 LAB — CBC
HCT: 36.9 % (ref 36.0–46.0)
HEMOGLOBIN: 12 g/dL (ref 12.0–15.0)
MCH: 28.9 pg (ref 26.0–34.0)
MCHC: 32.5 g/dL (ref 30.0–36.0)
MCV: 88.9 fL (ref 78.0–100.0)
PLATELETS: 187 10*3/uL (ref 150–400)
RBC: 4.15 MIL/uL (ref 3.87–5.11)
RDW: 20.5 % — ABNORMAL HIGH (ref 11.5–15.5)
WBC: 3.8 10*3/uL — AB (ref 4.0–10.5)

## 2015-11-27 LAB — MAGNESIUM: Magnesium: 1.6 mg/dL — ABNORMAL LOW (ref 1.7–2.4)

## 2015-11-27 LAB — SURGICAL PCR SCREEN
MRSA, PCR: POSITIVE — AB
Staphylococcus aureus: POSITIVE — AB

## 2015-11-27 SURGERY — COLONOSCOPY
Anesthesia: Moderate Sedation

## 2015-11-27 MED ORDER — CHLORHEXIDINE GLUCONATE CLOTH 2 % EX PADS
6.0000 | MEDICATED_PAD | Freq: Every day | CUTANEOUS | Status: DC
  Administered 2015-11-27: 6 via TOPICAL

## 2015-11-27 MED ORDER — DIPHENOXYLATE-ATROPINE 2.5-0.025 MG PO TABS
1.0000 | ORAL_TABLET | Freq: Four times a day (QID) | ORAL | Status: DC | PRN
  Administered 2015-11-28 (×2): 1 via ORAL
  Filled 2015-11-27 (×2): qty 1

## 2015-11-27 MED ORDER — FENTANYL CITRATE (PF) 100 MCG/2ML IJ SOLN
INTRAMUSCULAR | Status: DC | PRN
  Administered 2015-11-27 (×3): 25 ug via INTRAVENOUS

## 2015-11-27 MED ORDER — MIDAZOLAM HCL 5 MG/ML IJ SOLN
INTRAMUSCULAR | Status: AC
  Filled 2015-11-27: qty 2

## 2015-11-27 MED ORDER — SODIUM CHLORIDE 0.45 % IV SOLN
INTRAVENOUS | Status: DC
  Administered 2015-11-27 – 2015-11-28 (×3): via INTRAVENOUS

## 2015-11-27 MED ORDER — MUPIROCIN 2 % EX OINT
1.0000 "application " | TOPICAL_OINTMENT | Freq: Two times a day (BID) | CUTANEOUS | Status: DC
  Administered 2015-11-27 – 2015-11-28 (×4): 1 via NASAL
  Filled 2015-11-27: qty 22

## 2015-11-27 MED ORDER — MIDAZOLAM HCL 5 MG/5ML IJ SOLN
INTRAMUSCULAR | Status: DC | PRN
  Administered 2015-11-27 (×2): 1 mg via INTRAVENOUS
  Administered 2015-11-27 (×2): 2 mg via INTRAVENOUS

## 2015-11-27 MED ORDER — FENTANYL CITRATE (PF) 100 MCG/2ML IJ SOLN
INTRAMUSCULAR | Status: AC
  Filled 2015-11-27: qty 2

## 2015-11-27 MED ORDER — DIPHENHYDRAMINE HCL 50 MG/ML IJ SOLN
INTRAMUSCULAR | Status: AC
  Filled 2015-11-27: qty 1

## 2015-11-27 NOTE — Progress Notes (Signed)
PROGRESS NOTE    Kathryn Skinner  [id number] DOB: [birth year] DOA: 11/24/2015 PCP: No PCP Per Patient (Confirm with patient/family/NH records and if not entered, this HAS to be entered at [facility] point of entry. "No PCP" if truly none.)   Brief Narrative: (Start on day 1 of progress note - keep it brief and live) Persistent diarrhea, 45 y/o female with breast cancer, on oral chemotherapy, suspected chemo induced diarrhea.   Assessment & Plan:   Principal Problem:   Enterocolitis Active Problems:   Hypokalemia   Diarrhea   Dehydration   Sinus tachycardia (HCC)   Breast cancer (HCC)  1. Cardiovascular.  Antibiotic therapy has been dc. Suspected non infectious process, will continue supportive IV fluids, follow on cell count and temp curve.  2, Pulmonary, no signs of volume overload, will continue to monitor oxymetry, continue as needed supplemental 02 per [place] to target 02 sat above 92%  3, Nephrology noted worsening hypernatremia and hyperchloremia, will change fluids to 1.2 saline and follow on renal panel in am.    4. Endocrinology. Will continue levothyroxine per home regimen.  5, Gastroenterology. Noted colonoscopy with congested, erythematous, eroded, inflamed and vascluar pattern decreased on terminal ileum, suspected xeloda toxicity.   6, dvt px  Patient at moderate risk for worsening terminal ileum inflamation.  DVT prophylaxis: (Lovenox/Heparin/SCD's/anticoagulated/None (if comfort care) Code Status: (Full/Partial - specify details) Family Communication: (Specify name, relationship & date discussed. NO "discussed with patient") Disposition Plan: (specify when and where you expect patient to be discharged). Include barriers to DC in this tab.   Consultants:   gastroeneterology  Procedures: (Don't include imaging studies which can be auto populated. Include things that cannot be auto populated i.e. Echo, Carotid and venous dopplers, Foley, Bipap, HD, tubes/drains,  wound vac, central lines etc)    Antimicrobials: (specify start and planned stop date. Auto populated tables are space occupying and do not give end dates)  D.C   Subjective:  Patient had bowel prep yesterday, with loose stools, no abdominal pain, no nausea or vomiting. No chest pain or dyspnea.   Objective: Filed Vitals:   11/26/15 0402 11/26/15 1344 11/26/15 2120 11/27/15 0530  BP: 90/53 102/61 100/64 106/71  Pulse: 101 97 102 106  Temp: 98.4 F (36.9 C) 98.1 F (36.7 C) 98.4 F (36.9 C) 97.8 F (36.6 C)  TempSrc: Oral  Oral Oral  Resp: 18 17 18 20   Height:      Weight:      SpO2: 99%  100% 100%    Intake/Output Summary (Last 24 hours) at 11/27/15 1031 Last data filed at 11/27/15 0500  Gross per 24 hour  Intake   4940 ml  Output      0 ml  Net   4940 ml   Filed Weights   11/24/15 2123 11/25/15 0407  Weight: 78.155 kg (172 lb 4.8 oz) 79.243 kg (174 lb 11.2 oz)    Examination:  General exam:  Deconditioned.  E- ENT. Mild pallor but no icterus, oral mucosa moist. Respiratory system: Clear to auscultation. Respiratory effort normal. Mild decrease breath sounds at bases. Cardiovascular system: S1 & S2 heard, RRR. No JVD, murmurs, rubs, gallops or clicks. No pedal edema. Gastrointestinal system: Abdomen is mild distended, soft and nontender. No organomegaly or masses felt. Normal bowel sounds heard. Central nervous system: Alert and oriented. No focal neurological deficits. Extremities: Symmetric 5 x 5 power. Skin: No rashes, lesions or ulcers Psychiatry: Judgement and insight appear normal. Mood & affect appropriate.  Data Reviewed: I have personally reviewed following labs and imaging studies  CBC:  Recent Labs Lab 11/24/15 2130 11/26/15 0655 11/27/15 0524  WBC 5.0 4.3 3.8*  NEUTROABS 3.2  --   --   HGB 13.1 10.2* 12.0  HCT 38.3 30.8* 36.9  MCV 85.9 88.0 88.9  PLT 235 187 [id number]   Basic Metabolic Panel:  Recent Labs Lab 11/24/15 2130  11/25/15 0821 11/26/15 0655 11/27/15 0524  NA 136 135 139 145  K 2.8* 3.5 3.1* 3.9  CL 104 109 111 116*  CO2 23 21* 21* 21*  GLUCOSE 138* 101* 99 96  BUN 12 12 7  <5*  CREATININE 0.99 0.76 0.79 0.73  CALCIUM 8.4* 7.5* 8.0* 8.6*  MG 1.5*  --   --  1.6*   GFR: Estimated Creatinine Clearance: 94.3 mL/min (by C-G formula based on Cr of 0.73). Liver Function Tests:  Recent Labs Lab 11/24/15 2130 11/27/15 0524  AST 29 18  ALT 25 18  ALKPHOS 102 71  BILITOT 0.9 0.5  PROT 6.0* 5.0*  ALBUMIN 3.1* 2.4*    Recent Labs Lab 11/24/15 2130  LIPASE 32   No results for input(s): AMMONIA in the last 168 hours. Coagulation Profile: No results for input(s): INR, PROTIME in the last 168 hours. Cardiac Enzymes: No results for input(s): CKTOTAL, CKMB, CKMBINDEX, TROPONINI in the last 168 hours. BNP (last 3 results) No results for input(s): PROBNP in the last 8760 hours. HbA1C: No results for input(s): HGBA1C in the last 72 hours. CBG: No results for input(s): GLUCAP in the last 168 hours. Lipid Profile: No results for input(s): CHOL, HDL, LDLCALC, TRIG, CHOLHDL, LDLDIRECT in the last 72 hours. Thyroid Function Tests: No results for input(s): TSH, T4TOTAL, FREET4, T3FREE, THYROIDAB in the last 72 hours. Anemia Panel: No results for input(s): VITAMINB12, FOLATE, FERRITIN, TIBC, IRON, RETICCTPCT in the last 72 hours. Sepsis Labs:  Recent Labs Lab 11/24/15 2142  LATICACIDVEN 1.51    Recent Results (from the past 240 hour(s))  Gastrointestinal Panel by PCR , Stool     Status: None   Collection Time: 11/25/15  2:06 AM  Result Value Ref Range Status   Campylobacter species NOT DETECTED NOT DETECTED Final   Plesimonas shigelloides NOT DETECTED NOT DETECTED Final   Salmonella species NOT DETECTED NOT DETECTED Final   Yersinia enterocolitica NOT DETECTED NOT DETECTED Final   Vibrio species NOT DETECTED NOT DETECTED Final   Vibrio cholerae NOT DETECTED NOT DETECTED Final    Enteroaggregative E coli (EAEC) NOT DETECTED NOT DETECTED Final   Enteropathogenic E coli (EPEC) NOT DETECTED NOT DETECTED Final   Enterotoxigenic E coli (ETEC) NOT DETECTED NOT DETECTED Final   Shiga like toxin producing E coli (STEC) NOT DETECTED NOT DETECTED Final   E. coli O157 NOT DETECTED NOT DETECTED Final   Shigella/Enteroinvasive E coli (EIEC) NOT DETECTED NOT DETECTED Final   Cryptosporidium NOT DETECTED NOT DETECTED Final   Cyclospora cayetanensis NOT DETECTED NOT DETECTED Final   Entamoeba histolytica NOT DETECTED NOT DETECTED Final   Giardia lamblia NOT DETECTED NOT DETECTED Final   Adenovirus F40/41 NOT DETECTED NOT DETECTED Final   Astrovirus NOT DETECTED NOT DETECTED Final   Norovirus GI/GII NOT DETECTED NOT DETECTED Final   Rotavirus A NOT DETECTED NOT DETECTED Final   Sapovirus (I, II, IV, and V) NOT DETECTED NOT DETECTED Final  C difficile quick scan w PCR reflex     Status: None   Collection Time: 11/25/15  2:06 AM  Result  Value Ref Range Status   C Diff antigen NEGATIVE NEGATIVE Final   C Diff toxin NEGATIVE NEGATIVE Final   C Diff interpretation Negative for toxigenic C. difficile  Final  Surgical PCR screen     Status: Abnormal   Collection Time: 11/26/15  7:11 PM  Result Value Ref Range Status   MRSA, PCR POSITIVE (A) NEGATIVE Final    Comment: RESULT CALLED TO, READ BACK BY AND VERIFIED WITH: T BUTLER,RN @0050  11/27/15 MKELLY    Staphylococcus aureus POSITIVE (A) NEGATIVE Final    Comment:        The Xpert SA Assay (FDA approved for NASAL specimens in patients over 23 years of age), is one component of a comprehensive surveillance program.  Test performance has been validated by [facility] for patients greater than or equal to 52 year old. It is not intended to diagnose infection nor to guide or monitor treatment.          Radiology Studies: No results found.      Scheduled Meds: . Chlorhexidine Gluconate Cloth  6 each Topical Q0600   . enoxaparin (LOVENOX) injection  40 mg Subcutaneous Q24H  . hyaluronate sodium   Topical Daily  . levofloxacin (LEVAQUIN) IV  750 mg Intravenous Q24H  . levothyroxine  50 mcg Oral QAC breakfast  . metronidazole  500 mg Intravenous Q8H  . mupirocin ointment  1 application Nasal BID  . potassium chloride  20 mEq Oral BID  . sodium chloride flush  3 mL Intravenous Q12H   Continuous Infusions: . 0.9 % NaCl with KCl 40 mEq / L 125 mL/hr (11/27/15 0618)     LOS: 1 day        Jyles Sontag Gerome Apley, MD [facility] Pager [phone]  If 7PM-7AM, please contact night-coverage [email or web address] Password [email or web address] 11/27/2015, 10:31 AM

## 2015-11-27 NOTE — Progress Notes (Signed)
Patient admitted to 5W 19 from 2c. Patient is accompanied by her spouse who has been spending the night with her since admission. Per nursing report the spouse help the patient be more relax and has been complaint with request. Patient refused telemetry and SCD as soon as she hit the room. She doesn't want any more equipment that is not necessary. Patient oriented to the room and unit protocol.

## 2015-11-27 NOTE — Progress Notes (Signed)
    Progress Note   Subjective  Kathryn Skinner Is a 44 year old Caucasian female with a history of breast cancer who was consulted by our service yesterday for continued diarrhea after initiating Xeloda 1 month previously. This morning the patient is found lying in bed. She tells me she was able to finish all of her colonoscopy prep and was seeing "mostly clear" stools by early this morning. She denies any further questions regarding the procedure today. She does tell me her abdominal pain has gotten somewhat better overnight.   Objective   Vital signs in last 24 hours: Temp:  [97.8 F (36.6 C)-98.4 F (36.9 C)] 97.8 F (36.6 C) (06/07 0530) Pulse Rate:  [97-106] 106 (06/07 0530) Resp:  [17-20] 20 (06/07 0530) BP: (100-106)/(61-71) 106/71 mmHg (06/07 0530) SpO2:  [100 %] 100 % (06/07 0530) Last BM Date: 11/26/15 General:    white female in NAD Heart:  Regular rate and rhythm; no murmurs Lungs: Respirations even and unlabored, lungs CTA bilaterally Abdomen:  Soft, mild ttp in LLQ, improved from previous and nondistended. Normal bowel sounds. Extremities:  Without edema. Neurologic:  Alert and oriented,  grossly normal neurologically. Psych:  Cooperative. Normal mood and affect.   Lab Results:  Recent Labs  11/24/15 2130 11/26/15 0655 11/27/15 0524  WBC 5.0 4.3 3.8*  HGB 13.1 10.2* 12.0  HCT 38.3 30.8* 36.9  PLT 235 187 187   BMET  Recent Labs  11/25/15 0821 11/26/15 0655 11/27/15 0524  NA 135 139 145  K 3.5 3.1* 3.9  CL 109 111 116*  CO2 21* 21* 21*  GLUCOSE 101* 99 96  BUN 12 7 <5*  CREATININE 0.76 0.79 0.73  CALCIUM 7.5* 8.0* 8.6*   LFT  Recent Labs  11/27/15 0524  PROT 5.0*  ALBUMIN 2.4*  AST 18  ALT 18  ALKPHOS 71  BILITOT 0.5      Assessment / Plan:   Impression: 1. Diarrhea: 2 wk h/o 4-5 watery BM per day, did start Xeloda chemotherapy one mos ago, this was dc'd on Friday with no change in symptoms yet; Cdiff and gastro panel neg; pt started IV  Levaquin and flagyl 11/25/15-Consider side effect vs microscopic colitis vs other 2. Abdominal Pain: LLQ and suprapubic worse after BM, likely related to above and known enterocolitis; some improvement at time of exam this morning 3. Abnormal CT Abd/Pelvis: As above-multiple thickened and inflamed loops of small bowel noted throughout the abdomen. There was diffuse enhancement of the colonic mucosa. Loose stool was noted throughout the colon compatible with diarrheal state. This iwas thought most compatible with an entero-colitis, likely of infectious etiology. Inflammatory bowel disease could not be excluded. There was no evidence of bowel obstruction. Consider med side effect vs microscopic colitis vs viral vs infectious source 4. Breast cancer: dx Aug 2016;Currently undergoing chemo/XRT-see above 5. Hypokalemia: thought due to diarrhea, resolving  Plan: 1. Continue Levaquin and Flagyl for now 2. Patient did finish Movi-prep and reports clear looking BM this morning, colonoscopy scheduled for 2:00 with Dr. Carlean Purl 3. Will likely switch to Lomotil after colo today for continued diarrhea 4. Continue supportive measures 5. Will discuss above with Dr. Carlean Purl, please await any further recs  Thank you for your kind consultation, we will continue to follow.      LOS: 1 day   Levin Erp  11/27/2015, 9:49 AM

## 2015-11-27 NOTE — Op Note (Signed)
[facility] Patient Name: Kathryn Skinner Procedure Date : 11/27/2015 MRN: [id number] Attending MD: Gatha Mayer , MD Date of Birth: 1970-10-06 CSN: [id number] Age: 45 Admit Type: Inpatient Procedure:                Colonoscopy Indications:              Clinically significant diarrhea of unexplained                            origin Providers:                Gatha Mayer, MD, Vista Lawman, RN, Ralene Bathe,                            Technician Referring MD:             Tomi Bamberger. Vann Medicines:                Midazolam 6 mg IV, Fentanyl 75 micrograms IV Complications:            No immediate complications. Estimated Blood Loss:     Estimated blood loss was minimal. Procedure:                Pre-Anesthesia Assessment:                           - Prior to the procedure, a History and Physical                            was performed, and patient medications and                            allergies were reviewed. The patient's tolerance of                            previous anesthesia was also reviewed. The risks                            and benefits of the procedure and the sedation                            options and risks were discussed with the patient.                            All questions were answered, and informed consent                            was obtained. Prior Anticoagulants: The patient has                            taken no previous anticoagulant or antiplatelet                            agents. ASA Grade Assessment: II - A patient with  mild systemic disease. After reviewing the risks                            and benefits, the patient was deemed in                            satisfactory condition to undergo the procedure.                           After obtaining informed consent, the colonoscope                            was passed under direct vision. Throughout the                            procedure, the  patient's blood pressure, pulse, and                            oxygen saturations were monitored continuously. The                            EC-[id number] scope was introduced through                            the anus and advanced to the the terminal ileum,                            with identification of the appendiceal orifice and                            IC valve. The colonoscopy was performed without                            difficulty. The patient tolerated the procedure                            well. The quality of the bowel preparation was                            good. The terminal ileum, ileocecal valve,                            appendiceal orifice, and rectum were photographed.                            The bowel preparation used was MoviPrep. Scope In: 3:41:26 PM Scope Out: 3:58:28 PM Scope Withdrawal Time: 0 hours 10 minutes 51 seconds  Total Procedure Duration: 0 hours 17 minutes 2 seconds  Findings:      The perianal and digital rectal examinations were normal.      A diffuse area of mucosa in the terminal ileum was mildly congested,       erythematous, eroded, inflamed and vascular-pattern-decreased. Biopsies       were taken with a cold forceps for histology. Verification of patient  identification for the specimen was done. Estimated blood loss was       minimal.      The colon (entire examined portion) appeared normal. Biopsies for       histology were taken with a cold forceps from the entire colon for       evaluation of microscopic colitis. Verification of patient       identification for the specimen was done. Estimated blood loss was       minimal.      No additional abnormalities were found on retroflexion. Impression:               - Congested, erythematous, eroded, inflamed and                            vascular-pattern-decreased mucosa in the terminal                            ileum. Biopsied.                           - The  entire examined colon is normal. Biopsied.                           - suspect Xeloda toxicity/adverse effcects - colon                            mucosa overall normall but some areas decreased                            vascularity Moderate Sedation:      Moderate (conscious) sedation was administered by the endoscopy nurse       and supervised by the endoscopist. The following parameters were       monitored: oxygen saturation, heart rate, blood pressure, and response       to care. Total physician intraservice time was 20 minutes. Recommendation:           - Return patient to hospital ward for ongoing care.                           - Low fiber diet.                           - Continue present medications.                           - Lomotil 1 tablet PO PRN after loose bowel                            movement.                           - Stopped levaquin and metronidazole do not think                            this is infectious                           -  Repeat colonoscopy in 10 years for screening                            purposes. Procedure Code(s):        --- Professional ---                           [phone], Colonoscopy, flexible; with biopsy, single                            or multiple                           99152, Moderate sedation services provided by the                            same physician or other qualified health care                            professional performing the diagnostic or                            therapeutic service that the sedation supports,                            requiring the presence of an independent trained                            observer to assist in the monitoring of the                            patient's level of consciousness and physiological                            status; initial 15 minutes of intraservice time,                            patient age 37 years or older Diagnosis Code(s):        --- Professional  ---                           K52.9, Noninfective gastroenteritis and colitis,                            unspecified                           K63.89, Other specified diseases of intestine                           R19.7, Diarrhea, unspecified CPT copyright 2016 [employer]. All rights reserved. The codes documented in this report are preliminary and upon coder review may  be revised to meet current compliance requirements. Gatha Mayer, MD 11/27/2015 4:27:56 PM This report has been signed electronically. Number of Addenda: 0

## 2015-11-28 ENCOUNTER — Encounter (HOSPITAL_COMMUNITY): Payer: Self-pay | Admitting: Internal Medicine

## 2015-11-28 LAB — BASIC METABOLIC PANEL
Anion gap: 5 (ref 5–15)
BUN: <5 mg/dL — ABNORMAL LOW (ref 6–20)
CHLORIDE: 112 mmol/L — AB (ref 101–111)
CO2: 21 mmol/L — ABNORMAL LOW (ref 22–32)
Calcium: 8.4 mg/dL — ABNORMAL LOW (ref 8.9–10.3)
Creatinine, Ser: 0.66 mg/dL (ref 0.44–1.00)
Glucose, Bld: 96 mg/dL (ref 65–99)
POTASSIUM: 3 mmol/L — AB (ref 3.5–5.1)
SODIUM: 138 mmol/L (ref 135–145)

## 2015-11-28 LAB — CBC WITH DIFFERENTIAL/PLATELET
BASOS ABS: 0 10*3/uL (ref 0.0–0.1)
BASOS PCT: 1 %
EOS ABS: 0.4 10*3/uL (ref 0.0–0.7)
Eosinophils Relative: 8 %
HCT: 35.3 % — ABNORMAL LOW (ref 36.0–46.0)
HEMOGLOBIN: 11.9 g/dL — AB (ref 12.0–15.0)
LYMPHS ABS: 0.8 10*3/uL (ref 0.7–4.0)
Lymphocytes Relative: 20 %
MCH: 29.4 pg (ref 26.0–34.0)
MCHC: 33.7 g/dL (ref 30.0–36.0)
MCV: 87.2 fL (ref 78.0–100.0)
Monocytes Absolute: 0.8 10*3/uL (ref 0.1–1.0)
Monocytes Relative: 19 %
NEUTROS PCT: 52 %
Neutro Abs: 2.2 10*3/uL (ref 1.7–7.7)
PLATELETS: 199 10*3/uL (ref 150–400)
RBC: 4.05 MIL/uL (ref 3.87–5.11)
RDW: 20.1 % — ABNORMAL HIGH (ref 11.5–15.5)
WBC: 4.2 10*3/uL (ref 4.0–10.5)

## 2015-11-28 MED ORDER — OXYCODONE HCL 5 MG PO TABS: 5.0000 mg | ORAL_TABLET | Freq: Four times a day (QID) | ORAL | Status: DC | PRN

## 2015-11-28 MED ORDER — POTASSIUM CHLORIDE CRYS ER 20 MEQ PO TBCR
40.0000 meq | EXTENDED_RELEASE_TABLET | Freq: Once | ORAL | Status: AC
  Administered 2015-11-28: 40 meq via ORAL
  Filled 2015-11-28: qty 2

## 2015-11-28 NOTE — Discharge Summary (Addendum)
Kathryn Skinner, is a 45 y.o. female  DOB April 01, 1971  MRN [id number].  Admission date:  11/24/2015  Admitting Physician  Lily Kocher, MD  Discharge Date:  11/28/2015   Primary MD  No PCP Per Patient  Recommendations for primary care physician for things to follow:   Patient is being discharged home instructions follow-up with primary care physician and oncology as scheduled. Patient will stop xeloda  due to gastrointestinal side effects.   Admission Diagnosis  Sinus tachycardia (HCC) [R00.0] Hypokalemia [E87.6] Colitis [K52.9] Diarrhea, unspecified type [R19.7]   Discharge Diagnosis  Sinus tachycardia ([place]) [R00.0] Hypokalemia [E87.6] Colitis [K52.9] Diarrhea, unspecified type [R19.7]   Xeloda induced terminal ileitis  Principal Problem:   Enterocolitis Active Problems:   Hypokalemia   Diarrhea   Dehydration   Sinus tachycardia (HCC)   Breast cancer [name])      Past Medical History  Diagnosis Date  . Eye disease   . Fibromyalgia   . 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 Cholecystectomy    . Portacath placement    . Mastectomy Bilateral 2017  . Colonoscopy N/A 11/27/2015    Procedure: COLONOSCOPY;  Surgeon: Gatha Mayer, MD;  Location: [place];  Service: Endoscopy;  Laterality: N/A;       HPI  from the history and physical done on the day of admission:   This is a 45 year old female who presents to hospital with diarrhea, nausea and vomiting. Patient is known to have breast cancer actively undergoing radiation treatments as well as recently started on oral chemotherapy. Since start of the oral chemotherapy agent she has suffered from nonbloody diarrhea associated with diffuse abdominal pain, for last 2 days prior to admission she developed nausea and vomiting, unable to tolerate by mouth intake. On initial  physical examination patient was afebrile, blood pressure systolic was [id number], respiratory rate was 19-24, her mucous membranes were dry, her lungs were clear to auscultation, heart S1-S2 present rhythmic, abdomen was showing mild tenderness but no guarding or peritoneal signs. Her initial sodium was 136, potassium 2.8 with a creatinine 0.99, white count 5.0. Abdominal CT showed changes consistent with possible colitis.   Patient was admitted to hospital working diagnosis of abdominal pain due to colitis probably infectious complicated by hypokalemia   Hospital Course:    1. Cardiovascular. Patient remained hemodynamic stable, she received intravenous fluids with good toleration. Patient was initially placed on antibiotic therapy with ciprofloxacin and metronidazole. After colonoscopy as were discontinued.  2. Pulmonary. No signs of infection. Patient had continued on oximetry monitor.  3. Nephrology. Patient creatinine remained stable around 0.9, 0.7, potassium was repleted. Hypokalemia was presumed to be due to GI losses.  4. Gastrointestinal. Initially treated with antibiotic therapy to suspicious of infectious etiology.  Patient underwent colonoscopy which showed congested, erythematous, eroded, inflamed and vascular pattern decreased mucosal of the terminal ileum, antibodies were discontinued, it was presumed that mucosal changed were related to  oral chemotherapy agent.    Discharge Condition:  Stable  Follow UP   Primary care physician and primary oncologist  Consults obtained -gastrology  Diet and Activity recommendation: See Discharge Instructions below  Discharge Instructions    Presumed diet, follow-up as an outpatient as instructed.  Discharge Instructions    Diet - low sodium heart healthy    Complete by:  As directed      Discharge instructions    Complete by:  As directed   Follow up with primary care and Oncology as outpatient.     Increase activity slowly    Complete  by:  As directed              Discharge Medications       Medication List    STOP taking these medications        PRESCRIPTION MEDICATION      TAKE these medications        levothyroxine 50 MCG tablet  Commonly known as:  SYNTHROID, LEVOTHROID  Take 50 mcg by mouth daily before breakfast.     ondansetron 4 MG tablet  Commonly known as:  ZOFRAN  Take 4 mg by mouth every 4 (four) hours as needed for nausea or vomiting.     oxyCODONE 5 MG immediate release tablet  Commonly known as:  Oxy IR/ROXICODONE  Take 1 tablet (5 mg total) by mouth every 6 (six) hours as needed for moderate pain.        Major procedures and Radiology Reports - PLEASE review detailed and final reports for all details, in brief -      Ct Abdomen Pelvis W Contrast  11/25/2015  CLINICAL DATA:  45 year old female with abdominal pain and diarrhea x2 weeks. EXAM: CT ABDOMEN AND PELVIS WITH CONTRAST TECHNIQUE: Multidetector CT imaging of the abdomen and pelvis was performed using the standard protocol following bolus administration of intravenous contrast. CONTRAST:  133mL ISOVUE-300 IOPAMIDOL (ISOVUE-300) INJECTION 61% COMPARISON:  None. FINDINGS: The visualized lungs are clear. There is no cardiomegaly or pericardial effusion. No intra-abdominal free air or free fluid. Diffuse fatty infiltration of the liver. Cholecystectomy. The pancreas, spleen, adrenal glands, kidneys, visualized ureters, and urinary bladder appear unremarkable. The the uterus is anteverted and grossly unremarkable. Set multiple thickened and inflamed loops of small bowel noted throughout the abdomen. There is diffuse enhancement of the colonic mucosa. Loose stool noted throughout the colon compatible with diarrheal state. Findings are most compatible with an enterocolitis, likely of an infectious etiology. Correlation with clinical exam and stool cultures recommended. Underlying inflammatory bowel disease is not excluded. There is no evidence  of bowel obstruction. Normal appendix. The abdominal aorta and IVC appear unremarkable. No portal venous gas identified. There is no adenopathy. There are bilateral mastectomy changes. The chest and abdominal wall soft tissues are otherwise unremarkable. There bilateral L5 pars defects with grade 1 L5-S1 anterolisthesis. No acute osseous pathology. IMPRESSION: Intra colitis, likely on an infectious etiology. Underlying inflammatory bowel disease is not excluded. Correlation with clinical exam and stool cultures recommended. No bowel obstruction. Normal appendix. Electronically Signed   By: Anner Crete M.D.   On: 11/25/2015 00:40    Micro Results     Recent Results (from the past 240 hour(s))  Gastrointestinal Panel by PCR , Stool     Status: None   Collection Time: 11/25/15  2:06 AM  Result Value Ref Range Status   Campylobacter species NOT DETECTED NOT DETECTED Final   Plesimonas shigelloides NOT DETECTED NOT  DETECTED Final   Salmonella species NOT DETECTED NOT DETECTED Final   Yersinia enterocolitica NOT DETECTED NOT DETECTED Final   Vibrio species NOT DETECTED NOT DETECTED Final   Vibrio cholerae NOT DETECTED NOT DETECTED Final   Enteroaggregative E coli (EAEC) NOT DETECTED NOT DETECTED Final   Enteropathogenic E coli (EPEC) NOT DETECTED NOT DETECTED Final   Enterotoxigenic E coli (ETEC) NOT DETECTED NOT DETECTED Final   Shiga like toxin producing E coli (STEC) NOT DETECTED NOT DETECTED Final   E. coli O157 NOT DETECTED NOT DETECTED Final   Shigella/Enteroinvasive E coli (EIEC) NOT DETECTED NOT DETECTED Final   Cryptosporidium NOT DETECTED NOT DETECTED Final   Cyclospora cayetanensis NOT DETECTED NOT DETECTED Final   Entamoeba histolytica NOT DETECTED NOT DETECTED Final   Giardia lamblia NOT DETECTED NOT DETECTED Final   Adenovirus F40/41 NOT DETECTED NOT DETECTED Final   Astrovirus NOT DETECTED NOT DETECTED Final   Norovirus GI/GII NOT DETECTED NOT DETECTED Final   Rotavirus A  NOT DETECTED NOT DETECTED Final   Sapovirus (I, II, IV, and V) NOT DETECTED NOT DETECTED Final  C difficile quick scan w PCR reflex     Status: None   Collection Time: 11/25/15  2:06 AM  Result Value Ref Range Status   C Diff antigen NEGATIVE NEGATIVE Final   C Diff toxin NEGATIVE NEGATIVE Final   C Diff interpretation Negative for toxigenic C. difficile  Final  Surgical PCR screen     Status: Abnormal   Collection Time: 11/26/15  7:11 PM  Result Value Ref Range Status   MRSA, PCR POSITIVE (A) NEGATIVE Final    Comment: RESULT CALLED TO, READ BACK BY AND VERIFIED WITH: T BUTLER,RN @0050  11/27/15 MKELLY    Staphylococcus aureus POSITIVE (A) NEGATIVE Final    Comment:        The Xpert SA Assay (FDA approved for NASAL specimens in patients over 67 years of age), is one component of a comprehensive surveillance program.  Test performance has been validated by [facility] for patients greater than or equal to 72 year old. It is not intended to diagnose infection nor to guide or monitor treatment.        Today   Subjective    Kathryn Skinner is feeling better, no nausea or vomiting, diarrhea and abdominal pain has significantly improved.  Objective   Blood pressure 96/64, pulse 100, temperature 98 F (36.7 C), temperature source Oral, resp. rate 19, height 5\' 6"  (1.676 m), weight 79.243 kg (174 lb 11.2 oz), last menstrual period 11/02/2013, SpO2 100 %.   Intake/Output Summary (Last 24 hours) at 11/28/15 1103 Last data filed at 11/28/15 0900  Gross per 24 hour  Intake 1646.25 ml  Output      0 ml  Net 1646.25 ml    Exam Gen. Patient is awake and alert Oral mucosa is moist Lungs clear to auscultation bilaterally Heart S1-S2 present, no rubs or murmurs Abdomen soft nontender to deep palpation Extremities no edema Neurologically nonfocal   Data Review   CBC w Diff: Lab Results  Component Value Date   WBC 4.2 11/28/2015   HGB 11.9* 11/28/2015   HCT 35.3*  11/28/2015   PLT 199 11/28/2015   LYMPHOPCT 20 11/28/2015   MONOPCT 19 11/28/2015   EOSPCT 8 11/28/2015   BASOPCT 1 11/28/2015    CMP: Lab Results  Component Value Date   NA 138 11/28/2015   K 3.0* 11/28/2015   CL 112* 11/28/2015   CO2  21* 11/28/2015   BUN <5* 11/28/2015   CREATININE 0.66 11/28/2015   PROT 5.0* 11/27/2015   ALBUMIN 2.4* 11/27/2015   BILITOT 0.5 11/27/2015   ALKPHOS 71 11/27/2015   AST 18 11/27/2015   ALT 18 11/27/2015  .   Total Time in preparing paper work, data evaluation and todays exam - 45 minutes  Tawni Millers M.D on 11/28/2015 at 11:03 AM  [facility]   Office  [phone]     Late entry, pathology report: Diagnosis 1. Ileum, biopsy, terminal - SEVERELY ACTIVE ILEITIS. - THERE IS NO EVIDENCE OF DYSPLASIA OR MALIGNANCY. - SEE COMMENT. 2. Colon, biopsy, random - MINIMALLY ACTIVE COLITIS. - THERE IS NO EVIDENCE OF DYSPLASIA OR MALIGNANCY. - SEE COMMENT.  See full report

## 2015-11-28 NOTE — Progress Notes (Signed)
Discharge teaching and instructions reviewed. Pt has no further questions at this time. Pt discharging home via boyfriend/

## 2015-11-28 NOTE — Progress Notes (Signed)
Progress Note   Subjective  Kathryn Skinner is a 45 year old Caucasian female with a history of breast cancer who was consulted by our service on 11/26/15 for continued diarrhea after initiating Xeloda 1 month previously. This morning the patient is found lying in bed. She tells me that she has done well since the time of her colonoscopy yesterday. She does describe decreased abdominal pain, now feeling like " period cramps", instead of a 10/10 constant pain. She also tells me she has had a decrease in the amount of stools she is experiencing. She denies any new GI complaints.   Objective   Vital signs in last 24 hours: Temp:  [97.7 F (36.5 C)-98.4 F (36.9 C)] 98 F (36.7 C) (06/08 0622) Pulse Rate:  [90-106] 100 (06/08 0622) Resp:  [15-25] 19 (06/08 0622) BP: (92-122)/(50-86) 96/64 mmHg (06/08 0622) SpO2:  [100 %] 100 % (06/08 0622) Last BM Date: 11/28/15 General:Caucasian female in NAD Heart: Regular rate and rhythm; no murmurs Lungs: Respirations even and unlabored, lungs CTA bilaterally Abdomen: Soft, mild ttp in LLQ and nondistended. Normal bowel sounds. Extremities: Without edema. Neurologic: Alert and oriented, grossly normal neurologically. Psych: Cooperative. Normal mood and affect.  Intake/Output from previous day: 06/07 0701 - 06/08 0700 In: 2582.5 [P.O.:80; I.V.:2252.5; IV Piggyback:250] Out: -  Intake/Output this shift: Total I/O In: 150 [I.V.:150] Out: -   Lab Results:  Recent Labs  11/26/15 0655 11/27/15 0524 11/28/15 0509  WBC 4.3 3.8* 4.2  HGB 10.2* 12.0 11.9*  HCT 30.8* 36.9 35.3*  PLT 187 187 199   BMET  Recent Labs  11/26/15 0655 11/27/15 0524 11/28/15 0509  NA 139 145 138  K 3.1* 3.9 3.0*  CL 111 116* 112*  CO2 21* 21* 21*  GLUCOSE 99 96 96  BUN 7 <5* <5*  CREATININE 0.79 0.73 0.66  CALCIUM 8.0* 8.6* 8.4*   LFT  Recent Labs  11/27/15 0524  PROT 5.0*  ALBUMIN 2.4*  AST 18  ALT 18  ALKPHOS 71  BILITOT 0.5    PT/INR No results for input(s): LABPROT, INR in the last 72 hours.  Studies/Results: Colonoscopy 11/27/15 with Dr. Carlean Purl: Impression: Congested, erythematous, eroded, inflamed and vascular-pattern-decreased mucosa in the terminal ileum. Biopsied. The entire rest of the examined colon was normal. It was suspected to be Xeloda toxicity/adverse effects, overall the colon mucosa was normal with some areas of decreased vascularity.   Assessment / Plan:   Impression: 1. Diarrhea: 2 wk h/o 4-5 watery BM per day, did start Xeloda chemotherapy one mos ago, this was dc'd on Friday with no change in symptoms yet; Cdiff and gastro panel neg; pt started IV Levaquin and flagyl 11/25/15; colo yesterday as above with damaged mucosa in the ileum thought to likely be Xeloda effect; symptoms decreasing on prn Lomotil 2. Abdominal Pain: LLQ and suprapubic worse after BM, likely related to above and known enterocolitis; continued improvement today 3. Abnormal CT Abd/Pelvis: As above-multiple thickened and inflamed loops of small bowel noted throughout the abdomen. There was diffuse enhancement of the colonic mucosa. Loose stool was noted throughout the colon compatible with diarrheal state. This iwas thought most compatible with an entero-colitis, likely of infectious etiology. Inflammatory bowel disease could not be excluded. There was no evidence of bowel obstruction. See colonoscopy results above. Pathology pending 4. Breast cancer: dx Aug 2016;Currently undergoing chemo/XRT-see above 5. Hypokalemia: thought due to diarrhea, resolving  Plan:  1. Continue Lomotil 1 tablet by mouth when necessary after loose bowel  movement 2. Continue on low  fiber diet until abdominal pain decrease, then may slowly increase fiber as tolerated 3. Continue supportive measures 4. Will await results of biopsies taken at time of colonoscopy for any further recs 5. Will discuss above with Dr. Carlean Purl, please await any further  recommendations  Thank you for your kind consultation, likely we will sign off      LOS: 2 days   Kathryn Skinner  11/28/2015, 11:31 AM  Agree w/ Kathryn Skinner's management  Gatha Mayer, MD, Kathryn Skinner

## 2015-12-02 NOTE — Progress Notes (Signed)
Quick Note:  The biopsies suggest drug effect as we thought. She does not need to see me unless she does not continue to get better and back to normal over next few weeks - she was improving. Need to send a copy to her oncologist at [place] Dr. Verdell Carmine Colonoscopy recall 10 years ______

## 2016-08-13 DIAGNOSIS — E669 Obesity, unspecified: Secondary | ICD-10-CM | POA: Insufficient documentation

## 2016-09-18 ENCOUNTER — Emergency Department (HOSPITAL_COMMUNITY)
Admission: EM | Admit: 2016-09-18 | Discharge: 2016-09-18 | Disposition: A | Discharge status: Home / Self Care | Payer: Medicaid Other | Attending: Emergency Medicine | Admitting: Emergency Medicine

## 2016-09-18 ENCOUNTER — Encounter (HOSPITAL_COMMUNITY): Payer: Self-pay | Admitting: Emergency Medicine

## 2016-09-18 DIAGNOSIS — R110 Nausea: Secondary | ICD-10-CM | POA: Insufficient documentation

## 2016-09-18 DIAGNOSIS — R197 Diarrhea, unspecified: Secondary | ICD-10-CM | POA: Insufficient documentation

## 2016-09-18 DIAGNOSIS — Z853 Personal history of malignant neoplasm of breast: Secondary | ICD-10-CM | POA: Insufficient documentation

## 2016-09-18 DIAGNOSIS — Z79899 Other long term (current) drug therapy: Secondary | ICD-10-CM | POA: Insufficient documentation

## 2016-09-18 LAB — COMPREHENSIVE METABOLIC PANEL
ALT: 20 U/L (ref 14–54)
AST: 22 U/L (ref 15–41)
Albumin: 3.7 g/dL (ref 3.5–5.0)
Alkaline Phosphatase: 110 U/L (ref 38–126)
Anion gap: 8 (ref 5–15)
BUN: 20 mg/dL (ref 6–20)
CHLORIDE: 104 mmol/L (ref 101–111)
CO2: 27 mmol/L (ref 22–32)
CREATININE: 0.75 mg/dL (ref 0.44–1.00)
Calcium: 9.2 mg/dL (ref 8.9–10.3)
GFR calc Af Amer: >60 mL/min (ref >60)
GFR calc non Af Amer: >60 mL/min (ref >60)
Glucose, Bld: 99 mg/dL (ref 65–99)
Potassium: 4.2 mmol/L (ref 3.5–5.1)
SODIUM: 139 mmol/L (ref 135–145)
Total Bilirubin: 0.5 mg/dL (ref 0.3–1.2)
Total Protein: 7.6 g/dL (ref 6.5–8.1)

## 2016-09-18 LAB — URINALYSIS, ROUTINE W REFLEX MICROSCOPIC
Bilirubin Urine: NEGATIVE
Glucose, UA: NEGATIVE mg/dL
Hgb urine dipstick: NEGATIVE
KETONES UR: NEGATIVE mg/dL
Nitrite: NEGATIVE
PH: 5 (ref 5.0–8.0)
Protein, ur: NEGATIVE mg/dL
Specific Gravity, Urine: 1.025 (ref 1.005–1.030)

## 2016-09-18 LAB — CBC
HEMATOCRIT: 40.3 % (ref 36.0–46.0)
Hemoglobin: 13.2 g/dL (ref 12.0–15.0)
MCH: 28.8 pg (ref 26.0–34.0)
MCHC: 32.8 g/dL (ref 30.0–36.0)
MCV: 87.8 fL (ref 78.0–100.0)
PLATELETS: 230 10*3/uL (ref 150–400)
RBC: 4.59 MIL/uL (ref 3.87–5.11)
RDW: 14.4 % (ref 11.5–15.5)
WBC: 7.6 10*3/uL (ref 4.0–10.5)

## 2016-09-18 LAB — LIPASE, BLOOD: LIPASE: 17 U/L (ref 11–51)

## 2016-09-18 MED ORDER — KETOROLAC TROMETHAMINE 60 MG/2ML IM SOLN
60.0000 mg | Freq: Once | INTRAMUSCULAR | Status: AC
  Administered 2016-09-18: 60 mg via INTRAMUSCULAR
  Filled 2016-09-18: qty 2

## 2016-09-18 MED ORDER — ONDANSETRON 4 MG PO TBDP: 4.0000 mg | ORAL_TABLET | Freq: Once | ORAL | Status: DC

## 2016-09-18 MED ORDER — PSYLLIUM 28 % PO PACK: 1.0000 | PACK | Freq: Two times a day (BID) | ORAL | 1 refills | Status: DC

## 2016-09-18 MED ORDER — ONDANSETRON HCL 4 MG PO TABS: 4.0000 mg | ORAL_TABLET | Freq: Three times a day (TID) | ORAL | 0 refills | Status: DC | PRN

## 2016-09-18 MED ORDER — CIPROFLOXACIN HCL 500 MG PO TABS
500.0000 mg | ORAL_TABLET | Freq: Two times a day (BID) | ORAL | 0 refills | Status: DC
Start: 2016-09-18 — End: 2016-11-30

## 2016-09-18 NOTE — ED Triage Notes (Signed)
Patient complains of diarrhea x 5 days. States nausea, but denies vomiting.

## 2016-09-18 NOTE — ED Notes (Signed)
Upon attempt to discharge pt, the patient states she has been having pain in her left chest "since Ive been sitting in the hallway"   Dr Dayna Barker informed of same.

## 2016-09-18 NOTE — ED Provider Notes (Signed)
[facility] DEPT Provider Note   CSN: [id number] Arrival date & time: 09/18/16  1450     History   Chief Complaint Chief Complaint  Patient presents with  . Diarrhea  . Nausea    HPI Kathryn Skinner is a 46 y.o. female.   Diarrhea   This is a new problem. The current episode started more than 2 days ago. The problem occurs 2 to 4 times per day. The problem has not changed since onset.The stool consistency is described as watery. There has been no fever. Pertinent negatives include no vomiting. She has tried nothing for the symptoms. The treatment provided no relief. Risk factors include ill contacts.    Past Medical History:  Diagnosis Date  . Eye disease   . Fibromyalgia   . 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 Breast cancer ([place])   . Colitis 11/25/2015  . Enterocolitis 11/25/2015  . Hypokalemia 11/25/2015  . Diarrhea 11/25/2015  . Dehydration 11/25/2015  . Sinus tachycardia 11/25/2015    Past Surgical History:  Procedure Laterality Date  . CHOLECYSTECTOMY    . COLONOSCOPY N/A 11/27/2015   Procedure: COLONOSCOPY;  Surgeon: Gatha Mayer, MD;  Location: [facility];  Service: Endoscopy;  Laterality: N/A;  . MASTECTOMY Bilateral 2017  . PORTACATH PLACEMENT      OB History    Gravida Para Term Preterm AB Living   2 1     1 1    SAB TAB Ectopic Multiple Live Births   1       1       Home Medications    Prior to Admission medications   Medication Sig Start Date End Date Taking? Authorizing Provider  ciprofloxacin (CIPRO) 500 MG tablet Take 1 tablet (500 mg total) by mouth 2 (two) times daily. 09/18/16   Merrily Pew, MD  levothyroxine (SYNTHROID, LEVOTHROID) 50 MCG tablet Take 50 mcg by mouth daily before breakfast. 11/20/15   Historical Provider, MD  ondansetron (ZOFRAN) 4 MG tablet Take 1 tablet (4 mg total) by mouth every 8 (eight) hours as needed for nausea or vomiting. 09/18/16    Merrily Pew, MD  oxyCODONE (OXY IR/ROXICODONE) 5 MG immediate release tablet Take 1 tablet (5 mg total) by mouth every 6 (six) hours as needed for moderate pain. 11/28/15   Mauricio Gerome Apley, MD  psyllium (METAMUCIL SMOOTH TEXTURE) 28 % packet Take 1 packet by mouth 2 (two) times daily. 09/18/16   Merrily Pew, MD    Family History Family History  Problem Relation Age of Onset  . Thyroid disease Mother   . Thyroid disease Sister   . Asthma Sister   . Thyroid disease Maternal Grandmother   . Emphysema Maternal Grandmother   . Alcohol abuse Maternal Grandfather   . Liver disease Maternal Aunt     Social History Social History  Substance Use Topics  . Smoking status: Never Smoker  . Smokeless tobacco: Never Used  . Alcohol use No     Allergies   Sulfamethoxazole-trimethoprim   Review of Systems Review of Systems  Gastrointestinal: Positive for diarrhea and nausea. Negative for vomiting.  All other systems reviewed and are negative.    Physical Exam Updated Vital Signs BP 109/67   Pulse 76   Temp 97.8 F (36.6 C) (Oral)   Resp 17   Ht 5\' 6"  (1.676 m)   Wt 187 lb (84.8 kg)  LMP 11/02/2013   SpO2 99%   BMI 30.18 kg/m   Physical Exam  Constitutional: She is oriented to person, place, and time. She appears well-developed and well-nourished.  HENT:  Head: Normocephalic and atraumatic.  Eyes: Conjunctivae and EOM are normal.  Neck: Normal range of motion.  Cardiovascular: Normal rate and regular rhythm.   Pulmonary/Chest: Effort normal. No stridor. No respiratory distress.  Abdominal: Soft. She exhibits no distension.  Musculoskeletal: Normal range of motion. She exhibits no edema or deformity.  Neurological: She is alert and oriented to person, place, and time. No cranial nerve deficit. Coordination normal.  Skin: Skin is warm and dry. No erythema. No pallor.  Nursing note and vitals reviewed.    ED Treatments / Results  Labs (all labs ordered are  listed, but only abnormal results are displayed) Labs Reviewed  URINALYSIS, ROUTINE W REFLEX MICROSCOPIC - Abnormal; Notable for the following:       Result Value   Leukocytes, UA TRACE (*)    Bacteria, UA RARE (*)    Squamous Epithelial / LPF 0-5 (*)    All other components within normal limits  URINE CULTURE  LIPASE, BLOOD  COMPREHENSIVE METABOLIC PANEL  CBC    EKG  EKG Interpretation None       Radiology No results found.  Procedures Procedures (including critical care time)  Medications Ordered in ED Medications  ondansetron (ZOFRAN-ODT) disintegrating tablet 4 mg (not administered)  ketorolac (TORADOL) injection 60 mg (60 mg Intramuscular Given 09/18/16 2009)     Initial Impression / Assessment and Plan / ED Course  I have reviewed the triage vital signs and the nursing notes.  Pertinent labs & imaging results that were available during my care of the patient were reviewed by me and considered in my medical decision making (see chart for details).     Patient with diarrhea of unknown etiology. We'll try probiotics and increase her fiber intake over the next couple days if this doesn't work she will fill her prescription for ciprofloxacin and take that. Her urinalysis has some bacteria in it but I think is unlikely to be infected so we'll send for culture. Once again if this is infected she can take her ciprofloxacin.  Just prior to discharge patient started complaining of bilateral chest pain worse on the left. This was not new. She had no associated symptoms. EKG was done and was unremarkable. She had chest tenderness over previous mastectomy sites and this totally recreated her pain so I doubt cardiac or pulmonary causes. Patient's symptoms improved some Toradol and was discharged to follow-up with primary doctor.  Final Clinical Impressions(s) / ED Diagnoses   Final diagnoses:  Nausea  Diarrhea, unspecified type    New Prescriptions New Prescriptions    CIPROFLOXACIN (CIPRO) 500 MG TABLET    Take 1 tablet (500 mg total) by mouth 2 (two) times daily.   ONDANSETRON (ZOFRAN) 4 MG TABLET    Take 1 tablet (4 mg total) by mouth every 8 (eight) hours as needed for nausea or vomiting.   PSYLLIUM (METAMUCIL SMOOTH TEXTURE) 28 % PACKET    Take 1 packet by mouth 2 (two) times daily.     Merrily Pew, MD 09/18/16 2018

## 2016-09-20 LAB — URINE CULTURE

## 2016-09-24 ENCOUNTER — Emergency Department (HOSPITAL_COMMUNITY): Payer: Medicaid Other

## 2016-09-24 ENCOUNTER — Emergency Department (HOSPITAL_COMMUNITY)
Admission: EM | Admit: 2016-09-24 | Discharge: 2016-09-24 | Disposition: A | Discharge status: Home / Self Care | Payer: Medicaid Other | Attending: Emergency Medicine | Admitting: Emergency Medicine

## 2016-09-24 ENCOUNTER — Encounter (HOSPITAL_COMMUNITY): Payer: Self-pay | Admitting: Emergency Medicine

## 2016-09-24 DIAGNOSIS — Z79899 Other long term (current) drug therapy: Secondary | ICD-10-CM | POA: Diagnosis not present

## 2016-09-24 DIAGNOSIS — Z853 Personal history of malignant neoplasm of breast: Secondary | ICD-10-CM | POA: Insufficient documentation

## 2016-09-24 DIAGNOSIS — N83209 Unspecified ovarian cyst, unspecified side: Secondary | ICD-10-CM

## 2016-09-24 DIAGNOSIS — R109 Unspecified abdominal pain: Secondary | ICD-10-CM

## 2016-09-24 DIAGNOSIS — N83202 Unspecified ovarian cyst, left side: Secondary | ICD-10-CM | POA: Insufficient documentation

## 2016-09-24 DIAGNOSIS — R1084 Generalized abdominal pain: Secondary | ICD-10-CM | POA: Diagnosis present

## 2016-09-24 DIAGNOSIS — R911 Solitary pulmonary nodule: Secondary | ICD-10-CM | POA: Diagnosis not present

## 2016-09-24 HISTORY — DX: Pigmentary retinal dystrophy: H35.52

## 2016-09-24 LAB — CBC
HEMATOCRIT: 39.1 % (ref 36.0–46.0)
HEMOGLOBIN: 12.9 g/dL (ref 12.0–15.0)
MCH: 28.9 pg (ref 26.0–34.0)
MCHC: 33 g/dL (ref 30.0–36.0)
MCV: 87.5 fL (ref 78.0–100.0)
Platelets: 230 10*3/uL (ref 150–400)
RBC: 4.47 MIL/uL (ref 3.87–5.11)
RDW: 14.3 % (ref 11.5–15.5)
WBC: 6 10*3/uL (ref 4.0–10.5)

## 2016-09-24 LAB — COMPREHENSIVE METABOLIC PANEL
ALT: 23 U/L (ref 14–54)
ANION GAP: 6 (ref 5–15)
AST: 22 U/L (ref 15–41)
Albumin: 3.7 g/dL (ref 3.5–5.0)
Alkaline Phosphatase: 114 U/L (ref 38–126)
BUN: 15 mg/dL (ref 6–20)
CHLORIDE: 106 mmol/L (ref 101–111)
CO2: 26 mmol/L (ref 22–32)
Calcium: 9.2 mg/dL (ref 8.9–10.3)
Creatinine, Ser: 0.84 mg/dL (ref 0.44–1.00)
GFR calc non Af Amer: >60 mL/min (ref >60)
Glucose, Bld: 97 mg/dL (ref 65–99)
POTASSIUM: 4.5 mmol/L (ref 3.5–5.1)
Sodium: 138 mmol/L (ref 135–145)
TOTAL PROTEIN: 7.6 g/dL (ref 6.5–8.1)
Total Bilirubin: 0.8 mg/dL (ref 0.3–1.2)

## 2016-09-24 LAB — LIPASE, BLOOD: LIPASE: 15 U/L (ref 11–51)

## 2016-09-24 LAB — URINALYSIS, ROUTINE W REFLEX MICROSCOPIC
Bilirubin Urine: NEGATIVE
GLUCOSE, UA: NEGATIVE mg/dL
Hgb urine dipstick: NEGATIVE
KETONES UR: 5 mg/dL — AB
LEUKOCYTES UA: NEGATIVE
NITRITE: NEGATIVE
Protein, ur: NEGATIVE mg/dL
Specific Gravity, Urine: 1.015 (ref 1.005–1.030)
pH: 7 (ref 5.0–8.0)

## 2016-09-24 LAB — PREGNANCY, URINE: Preg Test, Ur: NEGATIVE

## 2016-09-24 MED ORDER — IOPAMIDOL (ISOVUE-300) INJECTION 61%
100.0000 mL | Freq: Once | INTRAVENOUS | Status: AC | PRN
  Administered 2016-09-24: 100 mL via INTRAVENOUS

## 2016-09-24 MED ORDER — DICYCLOMINE HCL 10 MG/ML IM SOLN
20.0000 mg | Freq: Once | INTRAMUSCULAR | Status: AC
  Administered 2016-09-24: 20 mg via INTRAMUSCULAR
  Filled 2016-09-24: qty 2

## 2016-09-24 MED ORDER — FAMOTIDINE IN NACL 20-0.9 MG/50ML-% IV SOLN
20.0000 mg | Freq: Once | INTRAVENOUS | Status: AC
  Administered 2016-09-24: 20 mg via INTRAVENOUS
  Filled 2016-09-24: qty 50

## 2016-09-24 MED ORDER — ONDANSETRON 4 MG PO TBDP: 4.0000 mg | ORAL_TABLET | Freq: Three times a day (TID) | ORAL | 0 refills | Status: DC | PRN

## 2016-09-24 MED ORDER — ONDANSETRON HCL 4 MG/2ML IJ SOLN
4.0000 mg | INTRAMUSCULAR | Status: DC | PRN
  Administered 2016-09-24: 4 mg via INTRAVENOUS
  Filled 2016-09-24: qty 2

## 2016-09-24 MED ORDER — NAPROXEN 250 MG PO TABS: 250.0000 mg | ORAL_TABLET | Freq: Two times a day (BID) | ORAL | 0 refills | Status: DC | PRN

## 2016-09-24 NOTE — ED Provider Notes (Signed)
[facility] DEPT Provider Note   CSN: [id number] Arrival date & time: 09/24/16  1307     History   Chief Complaint Chief Complaint  Patient presents with  . Abdominal Pain    HPI Kathryn Skinner is a 46 y.o. female.  HPI  Pt was seen at 1435.  Per pt, c/o gradual onset and persistence of constant generalized abd "pain" for the past 1 week.  Has been associated with multiple intermittent episodes of N/V, "feeling flushed" and having decreased appetite.  Describes the abd pain as "aching."  States she had diarrhea 1 week ago, but that has resolved. Pt was evaluated in the ED 1 week ago, dx possible colitis, rx abx. States she has taken the abx as prescribed. Denies any further diarrhea, no fevers, no back pain, no rash, no CP/SOB, no black or blood in stools or emesis.      Past Medical History:  Diagnosis Date  . Eye disease   . Fibromyalgia   . Retinitis pigmentosa   . 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 Breast cancer ([place])   . Colitis 11/25/2015  . Enterocolitis 11/25/2015  . Hypokalemia 11/25/2015  . Diarrhea 11/25/2015  . Dehydration 11/25/2015  . Sinus tachycardia 11/25/2015    Past Surgical History:  Procedure Laterality Date  . CHOLECYSTECTOMY    . COLONOSCOPY N/A 11/27/2015   Procedure: COLONOSCOPY;  Surgeon: Gatha Mayer, MD;  Location: [facility];  Service: Endoscopy;  Laterality: N/A;  . MASTECTOMY Bilateral 2017  . PORT-A-CATH REMOVAL    . PORTACATH PLACEMENT      OB History    Gravida Para Term Preterm AB Living   2 1     1 1    SAB TAB Ectopic Multiple Live Births   1       1       Home Medications    Prior to Admission medications   Medication Sig Start Date End Date Taking? Authorizing Provider  ciprofloxacin (CIPRO) 500 MG tablet Take 1 tablet (500 mg total) by mouth 2 (two) times daily. 09/18/16   Merrily Pew, MD  levothyroxine (SYNTHROID, LEVOTHROID) 50 MCG  tablet Take 50 mcg by mouth daily before breakfast. 11/20/15   Historical Provider, MD  ondansetron (ZOFRAN) 4 MG tablet Take 1 tablet (4 mg total) by mouth every 8 (eight) hours as needed for nausea or vomiting. 09/18/16   Merrily Pew, MD  oxyCODONE (OXY IR/ROXICODONE) 5 MG immediate release tablet Take 1 tablet (5 mg total) by mouth every 6 (six) hours as needed for moderate pain. 11/28/15   Mauricio Gerome Apley, MD  psyllium (METAMUCIL SMOOTH TEXTURE) 28 % packet Take 1 packet by mouth 2 (two) times daily. 09/18/16   Merrily Pew, MD    Family History Family History  Problem Relation Age of Onset  . Thyroid disease Mother   . Thyroid disease Sister   . Asthma Sister   . Thyroid disease Maternal Grandmother   . Emphysema Maternal Grandmother   . Alcohol abuse Maternal Grandfather   . Liver disease Maternal Aunt     Social History Social History  Substance Use Topics  . Smoking status: Never Smoker  . Smokeless tobacco: Never Used  . Alcohol use No     Allergies   Sulfamethoxazole-trimethoprim   Review of Systems Review of Systems ROS: Statement: All systems negative except as marked or noted in the HPI;  Constitutional: Negative for fever and chills. +"feeling flushed."; ; Eyes: Negative for eye pain, redness and discharge. ; ; ENMT: Negative for ear pain, hoarseness, nasal congestion, sinus pressure and sore throat. ; ; Cardiovascular: Negative for chest pain, palpitations, diaphoresis, dyspnea and peripheral edema. ; ; Respiratory: Negative for cough, wheezing and stridor. ; ; Gastrointestinal: +N/V, abd pain. Negative for diarrhea, blood in stool, hematemesis, jaundice and rectal bleeding. . ; ; Genitourinary: Negative for dysuria, flank pain and hematuria. ; ; Musculoskeletal: Negative for back pain and neck pain. Negative for swelling and trauma.; ; Skin: Negative for pruritus, rash, abrasions, blisters, bruising and skin lesion.; ; Neuro: Negative for headache, lightheadedness  and neck stiffness. Negative for weakness, altered level of consciousness, altered mental status, extremity weakness, paresthesias, involuntary movement, seizure and syncope.       Physical Exam Updated Vital Signs BP 137/83 (BP Location: Right Arm)   Pulse 95   Temp 98 F (36.7 C) (Oral)   Resp 18   Ht 5\' 6"  (1.676 m)   Wt 187 lb (84.8 kg)   LMP 11/02/2013   SpO2 100%   BMI 30.18 kg/m   Physical Exam 1440: Physical examination:  Nursing notes reviewed; Vital signs and O2 SAT reviewed;  Constitutional: Well developed, Well nourished, Well hydrated, In no acute distress; Head:  Normocephalic, atraumatic; Eyes: EOMI, PERRL, No scleral icterus; ENMT: Mouth and pharynx normal, Mucous membranes moist; Neck: Supple, Full range of motion, No lymphadenopathy; Cardiovascular: Regular rate and rhythm, No gallop; Respiratory: Breath sounds clear & equal bilaterally, No wheezes.  Speaking full sentences with ease, Normal respiratory effort/excursion; Chest: Nontender, Movement normal; Abdomen: Soft, +LUQ and LLQ abd tenderness to palp. No rebound or guarding. Nondistended, Normal bowel sounds; Genitourinary: No CVA tenderness; Extremities: Pulses normal, No tenderness, No edema, No calf edema or asymmetry.; Neuro: AA&Ox3, Major CN grossly intact.  Speech clear. No gross focal motor or sensory deficits in extremities.; Skin: Color normal, Warm, Dry.   ED Treatments / Results  Labs (all labs ordered are listed, but only abnormal results are displayed)   EKG  EKG Interpretation None       Radiology   Procedures Procedures (including critical care time)  Medications Ordered in ED Medications  dicyclomine (BENTYL) injection 20 mg (not administered)  famotidine (PEPCID) IVPB 20 mg premix (not administered)  ondansetron (ZOFRAN) injection 4 mg (not administered)     Initial Impression / Assessment and Plan / ED Course  I have reviewed the triage vital signs and the nursing  notes.  Pertinent labs & imaging results that were available during my care of the patient were reviewed by me and considered in my medical decision making (see chart for details).  MDM Reviewed: previous chart, nursing note and vitals Reviewed previous: labs Interpretation: labs, x-ray and CT scan   Results for orders placed or performed during the hospital encounter of 09/24/16  Lipase, blood  Result Value Ref Range   Lipase 15 11 - 51 U/L  Comprehensive metabolic panel  Result Value Ref Range   Sodium 138 135 - 145 mmol/L   Potassium 4.5 3.5 - 5.1 mmol/L   Chloride 106 101 - 111 mmol/L   CO2 26 22 - 32 mmol/L   Glucose, Bld 97 65 - 99 mg/dL   BUN 15 6 - 20 mg/dL   Creatinine, Ser 0.84 0.44 - 1.00 mg/dL   Calcium 9.2 8.9 - 10.3 mg/dL   Total Protein 7.6 6.5 - 8.1 g/dL   Albumin  3.7 3.5 - 5.0 g/dL   AST 22 15 - 41 U/L   ALT 23 14 - 54 U/L   Alkaline Phosphatase 114 38 - 126 U/L   Total Bilirubin 0.8 0.3 - 1.2 mg/dL   GFR calc non Af Amer >60 >60 mL/min   GFR calc Af Amer >60 >60 mL/min   Anion gap 6 5 - 15  CBC  Result Value Ref Range   WBC 6.0 4.0 - 10.5 K/uL   RBC 4.47 3.87 - 5.11 MIL/uL   Hemoglobin 12.9 12.0 - 15.0 g/dL   HCT 39.1 36.0 - 46.0 %   MCV 87.5 78.0 - 100.0 fL   MCH 28.9 26.0 - 34.0 pg   MCHC 33.0 30.0 - 36.0 g/dL   RDW 14.3 11.5 - 15.5 %   Platelets 230 150 - 400 K/uL  Urinalysis, Routine w reflex microscopic  Result Value Ref Range   Color, Urine YELLOW YELLOW   APPearance HAZY (A) CLEAR   Specific Gravity, Urine 1.015 1.005 - 1.030   pH 7.0 5.0 - 8.0   Glucose, UA NEGATIVE NEGATIVE mg/dL   Hgb urine dipstick NEGATIVE NEGATIVE   Bilirubin Urine NEGATIVE NEGATIVE   Ketones, ur 5 (A) NEGATIVE mg/dL   Protein, ur NEGATIVE NEGATIVE mg/dL   Nitrite NEGATIVE NEGATIVE   Leukocytes, UA NEGATIVE NEGATIVE  Pregnancy, urine  Result Value Ref Range   Preg Test, Ur NEGATIVE NEGATIVE   Dg Chest 2 View Result Date: 09/24/2016 CLINICAL DATA:  Abdominal  pain with nausea and vomiting. EXAM: CHEST  2 VIEW COMPARISON:  07/13/2013 FINDINGS: Normal heart size and mediastinal contours. No acute infiltrate or edema. No effusion or pneumothorax. Bilateral mastectomy. No acute osseous findings. Cholecystectomy. IMPRESSION: No active cardiopulmonary disease. Electronically Signed   By: Monte Fantasia M.D.   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 Sagittal and coronal MPR images reconstructed from axial data set. CONTRAST:  169mL ISOVUE-300 IOPAMIDOL (ISOVUE-300) INJECTION 61% IV. No oral contrast. COMPARISON:  11/25/2015 FINDINGS: Lower chest: Questionable tiny nodule at RIGHT lung base 4 mm diameter image 3, partially visualized on previous exam image 20. Lung bases otherwise clear. Hepatobiliary: Mild fatty infiltration of liver. Gallbladder surgically absent. 6 mm low-attenuation nodule anterior liver image 19, by coronal and sagittal images unchanged Pancreas: Normal appearance Spleen: Normal appearance Adrenals/Urinary Tract: Adrenal glands, kidneys, ureters, and bladder normal appearance Stomach/Bowel: Normal appendix. Bowel loops grossly normal appearance for exam lacking GI contrast. Stomach decompressed, with suboptimal assessment of proximal gastric wall thickness due to underdistention. Vascular/Lymphatic: Vascular structures unremarkable. No adenopathy. Reproductive: Uterus and RIGHT ovary unremarkable. Small cyst in LEFT ovary 2.5 x 2.0 x 2.8 cm image 68 with a dependent small fluid-fluid level question hemorrhage versus debris Other: No free air or free fluid.  No hernia or acute inflammatory process. Musculoskeletal: Grade 1 anterolisthesis L5-S1 with pseudo disc secondary to BILATERAL spondylolysis L5. IMPRESSION: 4 mm RIGHT lower lobe nodule, partially visualized on prior exam, stability uncertain; followup CT imaging recommended in 6 months to reassess. Mild fatty infiltration of liver with stable 6 mm low-attenuation liver nodule. Small mildly complicated LEFT ovarian cyst with a dependent  fluid-fluid level Electronically Signed   By: Lavonia Dana M.D.   On: 09/24/2016 16:44   [place] Art/ven Flow Abd Pelv Doppler Result Date: 09/24/2016 CLINICAL DATA:  Abdominal pain. Nausea and vomiting this morning. Follow-up ovarian cyst. Completed chemoradiation for LEFT breast cancer July 2017. EXAM: TRANSABDOMINAL AND TRANSVAGINAL ULTRASOUND OF PELVIS DOPPLER ULTRASOUND OF OVARIES TECHNIQUE: Both transabdominal and transvaginal ultrasound examinations of the pelvis were performed. Transabdominal technique was performed for global imaging of the pelvis including uterus, ovaries, adnexal regions, and pelvic cul-de-sac. It was necessary to proceed with endovaginal exam following the transabdominal exam to visualize the endometrium and adnexa. Color and duplex Doppler ultrasound was utilized to evaluate blood flow to the ovaries. COMPARISON:  CT abdomen and pelvis September 24, 2016 FINDINGS: Uterus Measurements: 6.2 x 2.9 x 3.6 cm. No fibroids or other mass visualized. Endometrium Thickness: 6 mm.  No focal abnormality visualized. Right ovary Measurements: 2 x 1.2 x 1.9 cm. Normal appearance/no adnexal mass. Left ovary Measurements: 3 x 2.7 x 2.2 cm. 2.4 x 2.6 x 2 cm anechoic cyst LEFT adnexal with increased through transmission, no solid components. Pulsed Doppler evaluation of both ovaries demonstrates normal low-resistance arterial and venous waveforms. Other findings No abnormal free fluid. IMPRESSION: 2.6 cm LEFT adnexal benign-appearing cyst. Electronically Signed   By: Elon Alas M.D.   On: 09/24/2016 19:07    1925:  Pt has tol PO well while in the ED without N/V.  No stooling while in the ED.  Abd benign, VSS. Feels better and wants to go home now. Tx symptomatically, f/u PMD. Dx and testing d/w pt.  Questions answered.  Verb understanding, agreeable to d/c home with outpt f/u.     Final Clinical Impressions(s) / ED Diagnoses   Final diagnoses:  None    New Prescriptions New Prescriptions   No medications on file     Francine Graven, DO 09/27/16 0003

## 2016-09-24 NOTE — ED Triage Notes (Signed)
PT c/o continued abdominal pain and still currently on oral antibiotics prescribed in the ED on 09/19/15. PT states nausea continues, feeling flush at times and  Decreased appetite. PT denies any urinary symptoms.

## 2016-09-24 NOTE — Discharge Instructions (Signed)
Take the prescriptions as directed.  Increase your fluid intake (ie:  Gatoraide) for the next few days.  Eat a bland diet and advance to your regular diet slowly as you can tolerate it.   Avoid full strength juices, as well as milk and milk products if you have diarrhea.   Call your regular medical doctor tomorrow to schedule a follow up appointment in the next 2 days. Call your OB/GYN doctor tomorrow to schedule a follow up appointment within the next week.  Return to the Emergency Department immediately sooner if worsening.

## 2016-11-30 ENCOUNTER — Ambulatory Visit (INDEPENDENT_AMBULATORY_CARE_PROVIDER_SITE_OTHER): Payer: Medicaid Other | Admitting: Obstetrics & Gynecology

## 2016-11-30 ENCOUNTER — Encounter: Payer: Self-pay | Admitting: Obstetrics & Gynecology

## 2016-11-30 VITALS — BP 130/88 | HR 81 | Ht 66.0 in | Wt 188.0 lb

## 2016-11-30 DIAGNOSIS — N83202 Unspecified ovarian cyst, left side: Secondary | ICD-10-CM

## 2016-11-30 DIAGNOSIS — C50919 Malignant neoplasm of unspecified site of unspecified female breast: Secondary | ICD-10-CM | POA: Diagnosis not present

## 2016-11-30 NOTE — Progress Notes (Signed)
Chief Complaint  Patient presents with  . Ovarian Cyst    left    Blood pressure 130/88, pulse 81, height '5\' 6"'$  (1.676 m), weight 188 lb (85.3 kg), last menstrual period 11/02/2013.  46 y.o. G2P0011 Patient's last menstrual period was 11/02/2013. The current method of family planning is none.  Outpatient Encounter Prescriptions as of 11/30/2016  Medication Sig  . levothyroxine (SYNTHROID, LEVOTHROID) 50 MCG tablet Take 50 mcg by mouth daily before breakfast.  . naproxen (NAPROSYN) 250 MG tablet Take 1 tablet (250 mg total) by mouth 2 (two) times daily as needed for mild pain or moderate pain (take with food). (Patient not taking: Reported on 11/30/2016)  . ondansetron (ZOFRAN ODT) 4 MG disintegrating tablet Take 1 tablet (4 mg total) by mouth every 8 (eight) hours as needed for nausea or vomiting. (Patient not taking: Reported on 11/30/2016)  . [DISCONTINUED] ciprofloxacin (CIPRO) 500 MG tablet Take 1 tablet (500 mg total) by mouth 2 (two) times daily.  . [DISCONTINUED] ondansetron (ZOFRAN) 4 MG tablet Take 1 tablet (4 mg total) by mouth every 8 (eight) hours as needed for nausea or vomiting. (Patient not taking: Reported on 09/24/2016)  . [DISCONTINUED] psyllium (METAMUCIL SMOOTH TEXTURE) 28 % packet Take 1 packet by mouth 2 (two) times daily. (Patient not taking: Reported on 09/24/2016)   No facility-administered encounter medications on file as of 11/30/2016.     Subjective Patient presents as a follow-up from 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 status post lumpectomy radiation and chemotherapy I no adjuvant chemotherapy As a result management of her ovaries would not be needed on the basis of her breast cancer status line  I'll bring her back in 1 week or so to do an ultrasound and I'll see her that day and go over the results of her ultrasound  She is having periods every 3 months or so which means she is certainly not menopausal at this point Objective No physical exam is done today   Pertinent ROS Patient is without complaints of urinary symptoms no nausea vomiting diarrhea no blood in her stool no blood in her urine no fevers chills  Labs or studies I reviewed [facility] regarding her breast cancer and adjuvant therapy as well as a CT scan ultrasound and labs from [facility]    Impression Diagnoses this Encounter::   ICD-10-CM   1. Cyst of left ovary N83.202 US Pelvis Complete    US Transvaginal Non-OB  2. 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   Labs or Scans Ordered: Orders Placed This Encounter  Procedures  . US Pelvis Complete  . US Transvaginal Non-OB    Management:: We'll schedule sonogram for the patient the next week or so in conjunction with her daughter's appointment and I'll see  her that day after the ultrasound discuss the results Based on the appearance of the mass and cyst on her ovary in April I doubt the need for removal but we will wait and see if there is been any progression in size or significant change in the structure  Follow up Return for GYN sono, Follow up, with Dr Elonda Husky.      Face to face time:  20 minutes  Greater than 50% of the visit time was spent in counseling and coordination of care with the patient.  The summary and outline of  the counseling and care coordination is summarized in the note above.   All questions were answered.  Past Medical History:  Diagnosis Date  . Eye disease   . Fibromyalgia   . Retinitis pigmentosa   . 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 Thyroid disease    hypothyroid    Past Surgical History:  Procedure Laterality Date  . CHOLECYSTECTOMY    . COLONOSCOPY N/A 11/27/2015   Procedure: COLONOSCOPY;  Surgeon: Gatha Mayer, MD;  Location: [place];  Service: Endoscopy;  Laterality: N/A;  . MASTECTOMY Bilateral 2017  . PORT-A-CATH REMOVAL    . PORTACATH PLACEMENT      OB History    Gravida Para Term Preterm AB Living   '2 1     1 1   '$ SAB TAB Ectopic Multiple Live Births   1       1      Allergies  Allergen Reactions  . Sulfamethoxazole-Trimethoprim Rash    Social History   Social History  . Marital status: Divorced    Spouse name: N/A  . Number of children: N/A  . Years of education: N/A   Social History Main Topics  . Smoking status: Never Smoker  . Smokeless tobacco: Never Used  . Alcohol use No  . Drug use: No  . Sexual activity: Not Currently    Birth control/ protection: None, Post-menopausal   Other Topics Concern  . None   Social History Narrative  . None    Family History  Problem Relation Age of Onset  . Thyroid disease Mother   . Thyroid disease Sister   . Asthma Sister   . Thyroid disease Maternal Grandmother   . Emphysema Maternal Grandmother   . Alcohol abuse Maternal Grandfather   . Liver disease Maternal Aunt

## 2016-12-10 ENCOUNTER — Encounter: Payer: Self-pay | Admitting: Family Medicine

## 2016-12-10 ENCOUNTER — Ambulatory Visit (INDEPENDENT_AMBULATORY_CARE_PROVIDER_SITE_OTHER): Payer: Medicaid Other | Admitting: Family Medicine

## 2016-12-10 ENCOUNTER — Ambulatory Visit: Payer: Medicaid Other | Admitting: Family Medicine

## 2016-12-10 VITALS — BP 120/78 | HR 84 | Temp 97.4°F | Resp 16 | Ht 66.0 in | Wt 186.1 lb

## 2016-12-10 DIAGNOSIS — C50911 Malignant neoplasm of unspecified site of right female breast: Secondary | ICD-10-CM

## 2016-12-10 DIAGNOSIS — Z171 Estrogen receptor negative status [ER-]: Secondary | ICD-10-CM

## 2016-12-10 DIAGNOSIS — C50912 Malignant neoplasm of unspecified site of left female breast: Secondary | ICD-10-CM | POA: Diagnosis not present

## 2016-12-10 DIAGNOSIS — H547 Unspecified visual loss: Secondary | ICD-10-CM | POA: Insufficient documentation

## 2016-12-10 DIAGNOSIS — K529 Noninfective gastroenteritis and colitis, unspecified: Secondary | ICD-10-CM

## 2016-12-10 DIAGNOSIS — H3552 Pigmentary retinal dystrophy: Secondary | ICD-10-CM | POA: Diagnosis not present

## 2016-12-10 DIAGNOSIS — Z23 Encounter for immunization: Secondary | ICD-10-CM

## 2016-12-10 DIAGNOSIS — E039 Hypothyroidism, unspecified: Secondary | ICD-10-CM

## 2016-12-10 MED ORDER — LEVOTHYROXINE SODIUM 50 MCG PO TABS: 50.0000 ug | ORAL_TABLET | Freq: Every day | ORAL | 3 refills | Status: DC

## 2016-12-10 NOTE — Progress Notes (Signed)
Chief Complaint  Patient presents with  . Follow-up  is under care oncology for breast cancer Chose a double mastectomy and is doing well Is scheduled to see plastic surgery for eval for implants,  Will need tissue expanders first Her oncologist told her to lose weight.  I agree.  We discussed that she needs to reduce sweets and carbs, and take a walk every day that she is able. 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  Never drove.  Went to special classes, graduated with her class.  Is disabled and never worked Has a 46 year old pregnant daughter who dropped out of school.  Mildly stressed. 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  No recurrence after cancer treatment Says shots up to date Had a PAP this year  Patient Active Problem List   Diagnosis Date Noted  . Retinitis pigmentosa 12/10/2016  . Hypothyroidism 12/10/2016  . Visual impairment 12/10/2016  . Obesity (BMI 30-39.9) 08/13/2016  . Breast cancer ([place])   . Enterocolitis 11/25/2015  . S/P mastectomy, bilateral 09/13/2015    Outpatient Encounter Prescriptions as of 12/10/2016  Medication Sig  . levothyroxine (SYNTHROID, LEVOTHROID) 50 MCG tablet Take 1 tablet (50 mcg total) by mouth daily before breakfast.   No facility-administered encounter medications on file as of 12/10/2016.     Past Medical History:  Diagnosis Date  . Allergy   . Eye disease   . Fibromyalgia   . Retinitis pigmentosa   . 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 Thyroid disease    hypothyroid    Past Surgical History:  Procedure Laterality Date  . BREAST SURGERY    . CHOLECYSTECTOMY    . COLONOSCOPY N/A 11/27/2015   Procedure: COLONOSCOPY;  Surgeon: Gatha Mayer, MD;  Location: [place];  Service: Endoscopy;  Laterality: N/A;  . MASTECTOMY Bilateral 2017  . PORT-A-CATH REMOVAL    . PORTACATH PLACEMENT      Social History    Social History  . Marital status: Divorced    Spouse name: N/A  . Number of children: 1  . Years of education: 21   Occupational History  . disabled     vision impairment   Social History Main Topics  . Smoking status: Never Smoker  . Smokeless tobacco: Never Used     Comment: N/I  . Alcohol use No  . Drug use: No  . Sexual activity: Not Currently    Birth control/ protection: None, Post-menopausal   Other Topics Concern  . Not on file   Social History Narrative   Lives at home with [place] impaired since age 69, has eye disease that disables from work   Does not Nurse, learning disability is 89 and pregnant    Family History  Problem Relation Age of Onset  . Thyroid disease Mother   . Thyroid disease Sister   . Asthma Sister   . Thyroid disease Maternal Grandmother   . Emphysema Maternal Grandmother   . Cancer Maternal Grandmother        lung  . Alcohol abuse Maternal Grandfather   . Cancer Maternal Grandfather        pancreatic  . Liver disease Maternal Aunt     Review of Systems  Constitutional: Negative for chills, fever and weight loss.  HENT: Negative for congestion and hearing loss.   Eyes: Positive for blurred vision. Negative for  pain.       Lacks peripheral vision  Respiratory: Negative for cough and shortness of breath.   Cardiovascular: Negative for chest pain and leg swelling.  Gastrointestinal: Negative for abdominal pain, constipation, diarrhea and heartburn.  Genitourinary: Negative for dysuria and frequency.  Musculoskeletal: Negative for falls, joint pain and myalgias.  Neurological: Negative for dizziness, seizures and headaches.  Psychiatric/Behavioral: Positive for depression. The patient is not nervous/anxious and does not have insomnia.        Unhappy with current body image    BP 120/78 (BP Location: Right Arm, Patient Position: Sitting, Cuff Size: Normal)   Pulse 84   Temp 97.4 F (36.3 C) (Temporal)   Resp 16   Ht 5\' 6"  (1.676 m)    Wt 186 lb 1.4 oz (84.4 kg)   LMP 11/02/2013   SpO2 100%   BMI 30.04 kg/m   Physical Exam  Constitutional: She is oriented to person, place, and time. She appears well-developed and well-nourished. No distress.  HENT:  Head: Normocephalic and atraumatic.  Mouth/Throat: Oropharynx is clear and moist.  Eyes: Conjunctivae are normal. Pupils are equal, round, and reactive to light.  Neck: Normal range of motion. No thyromegaly present.  Cardiovascular: Normal rate, regular rhythm and normal heart sounds.   Pulmonary/Chest: Effort normal and breath sounds normal.  Abdominal: Soft. Bowel sounds are normal.  obese  Musculoskeletal: Normal range of motion. She exhibits edema.  trace  Lymphadenopathy:    She has no cervical adenopathy.  Neurological: She is alert and oriented to person, place, and time.  Psychiatric: She has a normal mood and affect. Her behavior is normal. Thought content normal.    1.  Retinitis pigmentosa * 2. Hypothyroidism, unspecified type - CBC - COMPLETE METABOLIC PANEL WITH GFR - Lipid panel - VITAMIN D 25 Hydroxy (Vit-D Deficiency, Fractures) - Urinalysis, Routine w reflex microscopic - TSH  3. Malignant neoplasm of both breasts in female, estrogen receptor negative, unspecified site of breast ([place]) * 4. Need for vaccination against pertussis *Tdap 5. Visual impairment *from youth  6. Enterocolitis *history   Patient Instructions  Continue to walk every day that you are able Try to reduce the carbohydrates and sweets to reduce your weight Need blood testing  Need old PCP records  Due for blood work prior to next visit See me in 3 months   Raylene Everts, MD

## 2016-12-10 NOTE — Patient Instructions (Signed)
Continue to walk every day that you are able Try to reduce the carbohydrates and sweets to reduce your weight Need blood testing  Need old PCP records  Due for blood work prior to next visit See me in 3 months

## 2016-12-17 ENCOUNTER — Ambulatory Visit: Payer: Medicaid Other | Admitting: Obstetrics & Gynecology

## 2016-12-17 ENCOUNTER — Other Ambulatory Visit: Payer: Medicaid Other

## 2016-12-21 ENCOUNTER — Encounter: Payer: Self-pay | Admitting: Obstetrics & Gynecology

## 2016-12-21 ENCOUNTER — Ambulatory Visit (INDEPENDENT_AMBULATORY_CARE_PROVIDER_SITE_OTHER): Payer: Medicaid Other

## 2016-12-21 ENCOUNTER — Ambulatory Visit (INDEPENDENT_AMBULATORY_CARE_PROVIDER_SITE_OTHER): Payer: Medicaid Other | Admitting: Obstetrics & Gynecology

## 2016-12-21 VITALS — BP 100/64 | HR 92 | Wt 187.0 lb

## 2016-12-21 DIAGNOSIS — N83202 Unspecified ovarian cyst, left side: Secondary | ICD-10-CM | POA: Diagnosis not present

## 2016-12-21 DIAGNOSIS — C50919 Malignant neoplasm of unspecified site of unspecified female breast: Secondary | ICD-10-CM

## 2016-12-21 NOTE — Progress Notes (Signed)
PELVIC US TA/TV: Homogeneous anteverted uterus w/two small myometrial cysts (#1) 3.2 x 2.9 x 2.9 mm post mid (#2) fundal right 4.2 x 3 x 3 mm,normal ovaries bilat,(resolved left ovarian cyst),ovaries appear mobile,no free fluid,EEC 6.3 mm

## 2016-12-21 NOTE — Progress Notes (Signed)
Follow up appointment for results  Chief Complaint  Patient presents with  . Follow-up    gyn u/s    Blood pressure 100/64, pulse 92, weight 187 lb (84.8 kg).  US Transvaginal Non-ob  Result Date: 12/21/2016 GYNECOLOGIC SONOGRAM Kathryn Skinner is a 46 y.o. G2P0011 LMP 12/04/2016 for a pelvic sonogram to F/U left ovarian cyst. Uterus                      6.9 x 3.5 x 4.4 cm, vol 56.3 ml, homogeneous anteverted uterus w/two small myometrial cysts (#1) 3.2 x 2.9 x 2.9 mm post mid (#2) fundal right 4.2 x 3 x 3 mm Endometrium          6.3 mm, symmetrical, wnl Right ovary             1.7 x 1.3 x 1.8 cm, wnl Left ovary                2.9 x 1.1 x 1.5 cm, wnl No free fluid Technician Comments: PELVIC US TA/TV: Homogeneous anteverted uterus w/two small myometrial cysts (#1) 3.2 x 2.9 x 2.9 mm post mid (#2) fundal right 4.2 x 3 x 3 mm,normal ovaries bilat,(resolved left ovarian cyst),ovaries appear mobile,no free fluid,EEC 6.3 mm Amber J Carl 12/21/2016 9:15 AM Clinical Impression and recommendations: I have reviewed the sonogram results above, combined with the patient's current clinical course, below are my impressions and any appropriate recommendations for management based on the sonographic findings. Uterus with small myomas x 2 Endometrium is normal Both ovaries are normal, resolved left ovarian cyst Kathryn Skinner H 12/21/2016 9:21 AM   US Pelvis Complete  Result Date: 12/21/2016 GYNECOLOGIC SONOGRAM Kathryn Skinner is a 46 y.o. G2P0011 LMP 12/04/2016 for a pelvic sonogram to F/U left ovarian cyst. Uterus                      6.9 x 3.5 x 4.4 cm, vol 56.3 ml, homogeneous anteverted uterus w/two small myometrial cysts (#1) 3.2 x 2.9 x 2.9 mm post mid (#2) fundal right 4.2 x 3 x 3 mm Endometrium          6.3 mm, symmetrical, wnl Right ovary             1.7 x 1.3 x 1.8 cm, wnl Left ovary                2.9 x 1.1 x 1.5 cm, wnl No free fluid Technician Comments: PELVIC US TA/TV: Homogeneous anteverted uterus w/two  small myometrial cysts (#1) 3.2 x 2.9 x 2.9 mm post mid (#2) fundal right 4.2 x 3 x 3 mm,normal ovaries bilat,(resolved left ovarian cyst),ovaries appear mobile,no free fluid,EEC 6.3 mm Amber J Carl 12/21/2016 9:15 AM Clinical Impression and recommendations: I have reviewed the sonogram results above, combined with the patient's current clinical course, below are my impressions and any appropriate recommendations for management based on the sonographic findings. Uterus with small myomas x 2 Endometrium is normal Both ovaries are normal, resolved left ovarian cyst Kathryn Skinner H 12/21/2016 9:21 AM   Please refer to the Subjective portion of my note from the patient's 11/30/2016 visit:  Subjective  Patient presents as a follow-up from [facility] ED for evaluation of 2.6 cm left ovarian cyst She presented originally with abdominal pain nausea and vomiting and a CT scan revealed the cyst was otherwise negative the ultrasounds and performed which confirmed the presence 2.6 cm History she  was having abdominal pain nausea and vomiting that I don't think it was caused by the cyst and since that time and her os symptoms have subsequently resolved  Of note she is ER PR negative and HER-2 negative breast cancer of the right breast status post lumpectomy radiation and chemotherapy I no adjuvant chemotherapy As a result management of her ovaries would not be needed on the basis of her breast cancer status line  I'll bring her back in 1 week or so to do an ultrasound and I'll see her that day and go over the results of her ultrasound  She is having periods every 3 months or so which means she is certainly not menopausal at this point     MEDS ordered this encounter: No orders of the defined types were placed in this encounter.   Orders for this encounter: No orders of the defined types were placed in this encounter.   Impression: Cyst of left ovary - resolved on sonogram 12/21/2016  Triple negative  malignant neoplasm of breast ([place])    Plan: No further follow-up of the left ovarian cyst is needed The patient is it appears in her perimenopausal phase of life with periods approximately every 3 months or so If she has any other problems with that or suspicion of ovarian cysts she will return  Again upon review with the patient being triple negative breast cancer prophylactic oophorectomy would not be indicated or helpful with her disease process  Follow Up: Return if symptoms worsen or fail to improve, for yearly.       Face to face time:  15 minutes  Greater than 50% of the visit time was spent in counseling and coordination of care with the patient.  The summary and outline of the counseling and care coordination is summarized in the note above.   All questions were answered.  Past Medical History:  Diagnosis Date  . Allergy   . Eye disease   . Fibromyalgia   . Retinitis pigmentosa   . 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 Thyroid disease    hypothyroid    Past Surgical History:  Procedure Laterality Date  . BREAST SURGERY    . CHOLECYSTECTOMY    . COLONOSCOPY N/A 11/27/2015   Procedure: COLONOSCOPY;  Surgeon: Gatha Mayer, MD;  Location: [place];  Service: Endoscopy;  Laterality: N/A;  . MASTECTOMY Bilateral 2017  . PORT-A-CATH REMOVAL    . PORTACATH PLACEMENT      OB History    Gravida Para Term Preterm AB Living   _0 SAB TAB Ectopic Multiple Live Births   1       1      Allergies  Allergen Reactions  . Sulfamethoxazole-Trimethoprim Rash    Social History   Social History  . Marital status: Divorced    Spouse name: N/A  . Number of children: 1  . Years of education: 14   Occupational History  . disabled     vision impairment   Social History Main Topics  . Smoking status: Never Smoker  . Smokeless tobacco: Never Used     Comment: N/I  . Alcohol use No  . Drug use: No  . Sexual activity: Not Currently     Birth control/ protection: None, Post-menopausal   Other Topics Concern  . None   Social History Narrative   Lives at home with [employer]  impaired since age 32, has eye disease that disables from work   Does not Nurse, learning disability is 75 and pregnant    Family History  Problem Relation Age of Onset  . Thyroid disease Mother   . Thyroid disease Sister   . Asthma Sister   . Thyroid disease Maternal Grandmother   . Emphysema Maternal Grandmother   . Cancer Maternal Grandmother        lung  . Alcohol abuse Maternal Grandfather   . Cancer Maternal Grandfather        pancreatic  . Liver disease Maternal Aunt

## 2016-12-24 ENCOUNTER — Telehealth: Payer: Self-pay

## 2016-12-24 NOTE — Telephone Encounter (Signed)
Left message to call office

## 2016-12-24 NOTE — Telephone Encounter (Signed)
-----   Message from Raylene Everts, MD sent at 12/10/2016  5:51 PM EDT ----- Left without her TdaP.  Call and see if she wants to come in prior to her next visit

## 2017-03-12 ENCOUNTER — Ambulatory Visit: Payer: Medicaid Other | Admitting: Family Medicine

## 2017-04-14 ENCOUNTER — Ambulatory Visit: Payer: Medicaid Other | Admitting: Family Medicine

## 2017-04-27 ENCOUNTER — Encounter: Payer: Self-pay | Admitting: Family Medicine

## 2017-04-27 ENCOUNTER — Ambulatory Visit: Payer: Medicaid Other | Admitting: Family Medicine

## 2017-04-27 VITALS — BP 122/80 | HR 88 | Temp 97.8°F | Resp 16 | Ht 66.0 in | Wt 187.1 lb

## 2017-04-27 DIAGNOSIS — H3552 Pigmentary retinal dystrophy: Secondary | ICD-10-CM | POA: Diagnosis not present

## 2017-04-27 DIAGNOSIS — E039 Hypothyroidism, unspecified: Secondary | ICD-10-CM | POA: Diagnosis not present

## 2017-04-27 DIAGNOSIS — C50912 Malignant neoplasm of unspecified site of left female breast: Secondary | ICD-10-CM

## 2017-04-27 DIAGNOSIS — Z171 Estrogen receptor negative status [ER-]: Secondary | ICD-10-CM

## 2017-04-27 DIAGNOSIS — C50911 Malignant neoplasm of unspecified site of right female breast: Secondary | ICD-10-CM | POA: Diagnosis not present

## 2017-04-27 NOTE — Progress Notes (Signed)
Chief Complaint  Patient presents with  . Follow-up   Patient is here for routine follow-up. She has no new complaints. She is overdue to have her thyroid lab checked, and will go to the lab today She states that she eats well and tries to exercise.  She has not managed to lose any weight. She still trying to make a decision whether she wants to have reconstructive surgery after her bilateral mastectomy. She has a new granddaughter, 7 months old   Patient Active Problem List   Diagnosis Date Noted  . Retinitis pigmentosa 12/10/2016  . Hypothyroidism 12/10/2016  . Visual impairment 12/10/2016  . Obesity (BMI 30-39.9) 08/13/2016  . Breast cancer ([place])   . Enterocolitis 11/25/2015  . S/P mastectomy, bilateral 09/13/2015    Outpatient Encounter Medications as of 04/27/2017  Medication Sig  . ibuprofen (ADVIL,MOTRIN) 800 MG tablet TK 1 T PO Q 8 H PRF PAIN  . levothyroxine (SYNTHROID, LEVOTHROID) 50 MCG tablet Take 1 tablet (50 mcg total) by mouth daily before breakfast.   No facility-administered encounter medications on file as of 04/27/2017.     Allergies  Allergen Reactions  . Sulfamethoxazole-Trimethoprim Rash    Review of Systems  Constitutional: Negative for activity change, appetite change and unexpected weight change.  HENT: Negative for congestion, dental problem, postnasal drip and rhinorrhea.   Eyes: Positive for visual disturbance. Negative for redness.       Legally blind  Respiratory: Negative for cough and shortness of breath.   Cardiovascular: Negative for chest pain, palpitations and leg swelling.  Gastrointestinal: Negative for abdominal pain, constipation and diarrhea.  Genitourinary: Negative for difficulty urinating, frequency and menstrual problem.  Musculoskeletal: Positive for back pain. Negative for arthralgias.       Occasional back soreness  Neurological: Negative for dizziness and headaches.  Psychiatric/Behavioral: Negative for dysphoric  mood and sleep disturbance. The patient is not nervous/anxious.     BP 122/80 (BP Location: Right Arm, Patient Position: Sitting, Cuff Size: Normal)   Pulse 88   Temp 97.8 F (36.6 C) (Temporal)   Resp 16   Ht 5\' 6"  (1.676 m)   Wt 187 lb 1.3 oz (84.9 kg)   SpO2 100%   BMI 30.20 kg/m   Physical Exam  Constitutional: She is oriented to person, place, and time. She appears well-developed and well-nourished. No distress.  HENT:  Head: Normocephalic and atraumatic.  Mouth/Throat: Oropharynx is clear and moist.  Eyes: Conjunctivae are normal. Pupils are equal, round, and reactive to light.  Pedunculated skin tag right upper eyelid  Neck: Normal range of motion. No thyromegaly present.  Cardiovascular: Normal rate, regular rhythm and normal heart sounds.  Pulmonary/Chest: Effort normal and breath sounds normal.  Abdominal: Soft.  obese  Musculoskeletal: Normal range of motion. She exhibits edema.  trace  Lymphadenopathy:    She has no cervical adenopathy.  Neurological: She is alert and oriented to person, place, and time.  Psychiatric: She has a normal mood and affect. Her behavior is normal. Thought content normal.    ASSESSMENT/PLAN:  1. Hypothyroidism, unspecified type Labs today.  No symptoms of high or low thyroid, heat or cold intolerance.  She does have difficulty losing weight  2. Retinitis pigmentosa Since birth  24. Malignant neoplasm of both breasts in female, estrogen receptor negative, unspecified site of breast ([place]) Under care of oncology.  Considering reconstructive surgery   Patient Instructions  Walk every day that you are able  Get labs today I will  send you a letter with your test results.  If there is anything of concern, we will call right away.  See me in six months Call sooner for problems   Raylene Everts, MD

## 2017-04-27 NOTE — Patient Instructions (Signed)
Walk every day that you are able  Get labs today I will send you a letter with your test results.  If there is anything of concern, we will call right away.  See me in six months Call sooner for problems

## 2017-04-30 ENCOUNTER — Encounter: Payer: Self-pay | Admitting: Family Medicine

## 2017-04-30 LAB — COMPLETE METABOLIC PANEL WITH GFR
AG Ratio: 1.1 (calc) (ref 1.0–2.5)
ALBUMIN MSPROF: 4 g/dL (ref 3.6–5.1)
ALKALINE PHOSPHATASE (APISO): 105 U/L (ref 33–115)
ALT: 16 U/L (ref 6–29)
AST: 16 U/L (ref 10–35)
BILIRUBIN TOTAL: 0.4 mg/dL (ref 0.2–1.2)
BUN: 17 mg/dL (ref 7–25)
CO2: 31 mmol/L (ref 20–32)
Calcium: 9.4 mg/dL (ref 8.6–10.2)
Chloride: 103 mmol/L (ref 98–110)
Creat: 0.85 mg/dL (ref 0.50–1.10)
GFR, EST AFRICAN AMERICAN: 95 mL/min/{1.73_m2} (ref >=60)
GFR, EST NON AFRICAN AMERICAN: 82 mL/min/{1.73_m2} (ref >=60)
GLUCOSE: 80 mg/dL (ref 65–99)
Globulin: 3.5 g/dL (calc) (ref 1.9–3.7)
POTASSIUM: 4.5 mmol/L (ref 3.5–5.3)
Sodium: 139 mmol/L (ref 135–146)
Total Protein: 7.5 g/dL (ref 6.1–8.1)

## 2017-04-30 LAB — LIPID PANEL
CHOL/HDL RATIO: 3.4 (calc) (ref <5.0)
Cholesterol: 220 mg/dL — ABNORMAL HIGH (ref <200)
HDL: 64 mg/dL (ref >50)
LDL Cholesterol (Calc): 128 mg/dL (calc) — ABNORMAL HIGH
NON-HDL CHOLESTEROL (CALC): 156 mg/dL — AB (ref <130)
TRIGLYCERIDES: 165 mg/dL — AB (ref <150)

## 2017-04-30 LAB — URINALYSIS, ROUTINE W REFLEX MICROSCOPIC
Bilirubin Urine: NEGATIVE
GLUCOSE, UA: NEGATIVE
HGB URINE DIPSTICK: NEGATIVE
HYALINE CAST: NONE SEEN /LPF
Ketones, ur: NEGATIVE
Nitrite: NEGATIVE
PH: 5.5 (ref 5.0–8.0)
Protein, ur: NEGATIVE
RBC / HPF: NONE SEEN /HPF (ref 0–2)
SPECIFIC GRAVITY, URINE: 1.018 (ref 1.001–1.03)

## 2017-04-30 LAB — CBC
HCT: 39 % (ref 35.0–45.0)
HEMOGLOBIN: 12.8 g/dL (ref 11.7–15.5)
MCH: 28.3 pg (ref 27.0–33.0)
MCHC: 32.8 g/dL (ref 32.0–36.0)
MCV: 86.1 fL (ref 80.0–100.0)
MPV: 9.8 fL (ref 7.5–12.5)
Platelets: 271 10*3/uL (ref 140–400)
RBC: 4.53 10*6/uL (ref 3.80–5.10)
RDW: 13.1 % (ref 11.0–15.0)
WBC: 6.4 10*3/uL (ref 3.8–10.8)

## 2017-04-30 LAB — VITAMIN D 25 HYDROXY (VIT D DEFICIENCY, FRACTURES): VIT D 25 HYDROXY: 26 ng/mL — AB (ref 30–100)

## 2017-04-30 LAB — TSH: TSH: 4.04 mIU/L

## 2017-06-09 ENCOUNTER — Telehealth: Payer: Self-pay | Admitting: Family Medicine

## 2017-06-18 IMAGING — CT CT ABD-PELV W/ CM
2 of 5 series · 16 of 46 positions shown, 18 images · IV contrast (iopamidol)
Comparison: 11/25/2015

CLINICAL DATA: Nausea and diarrhea, abdominal pain for 1 week,
history spindle cell carcinoma of the LEFT breast post mastectomy,
chemotherapy and radiation therapy

EXAM:
CT ABDOMEN AND PELVIS WITH CONTRAST
TECHNIQUE: Multidetector CT imaging of the abdomen and pelvis was performed
using the standard protocol following bolus administration of
intravenous contrast. Sagittal and coronal MPR images reconstructed
from axial data set.
CONTRAST:  100mL [id number] IOPAMIDOL ([id number]-[id number]) INJECTION 61% IV.
No oral contrast.

[Series 2: axial st · axial · 0.72mm/px · z∈[-423,-23]mm · 13 of 90 slices shown, 15 images]
[im 5/90  soft-tissue]
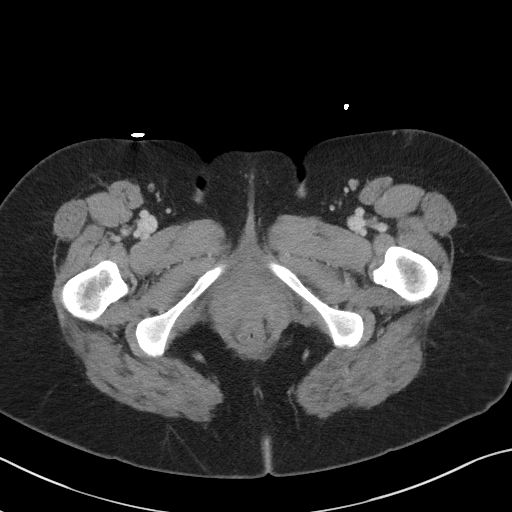
[im 5/90  bone]
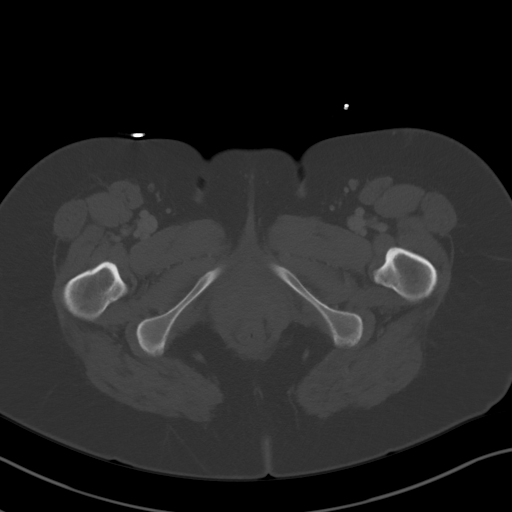
[im 15/90  soft-tissue]
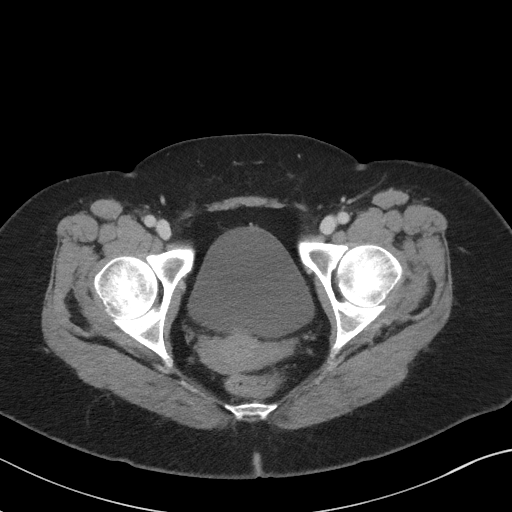
[im 19/90  soft-tissue]
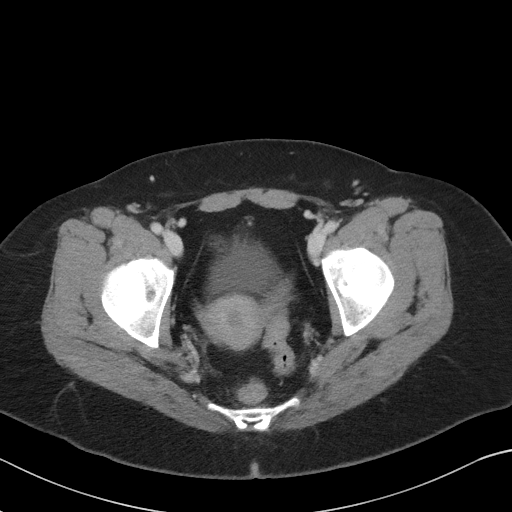
[im 24/90  soft-tissue]
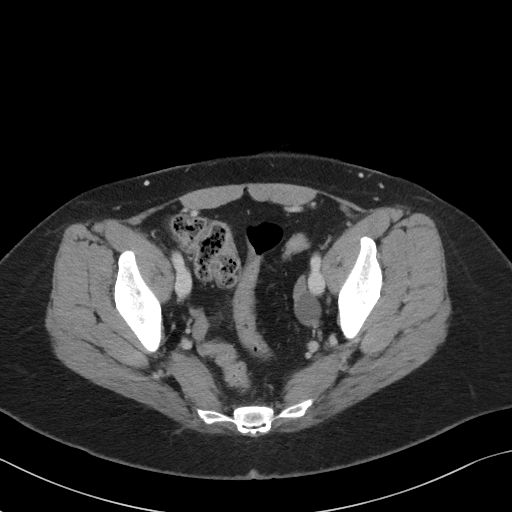
[im 33/90  soft-tissue]
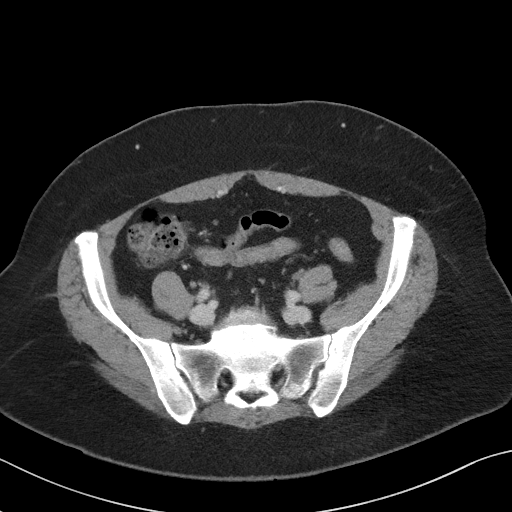
[im 38/90  soft-tissue]
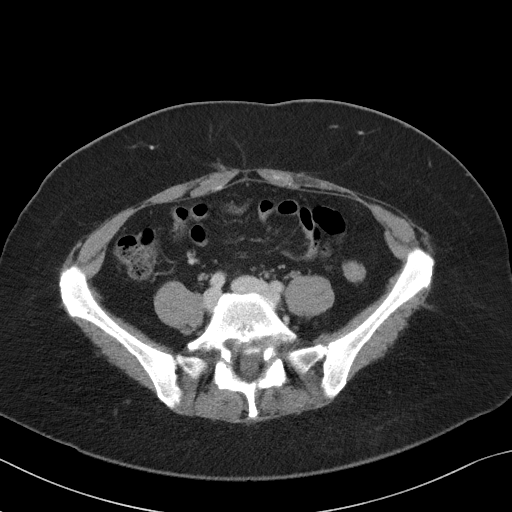
[im 47/90  soft-tissue]
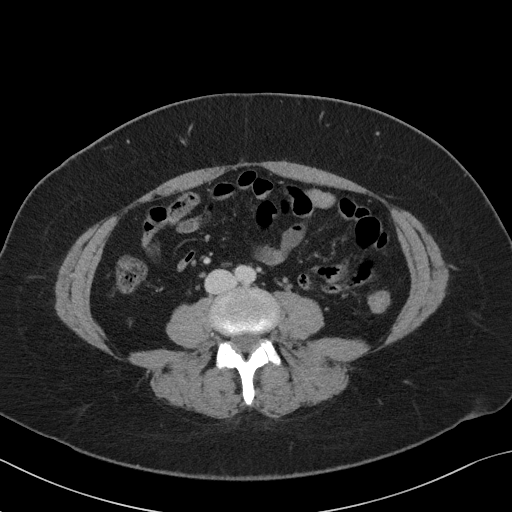
[im 52/90  soft-tissue]
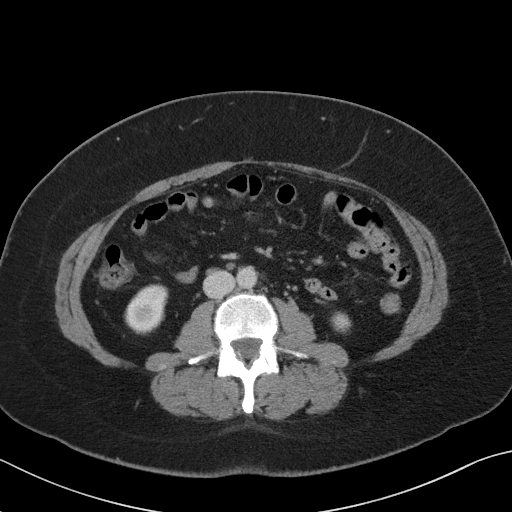
[im 57/90  soft-tissue]
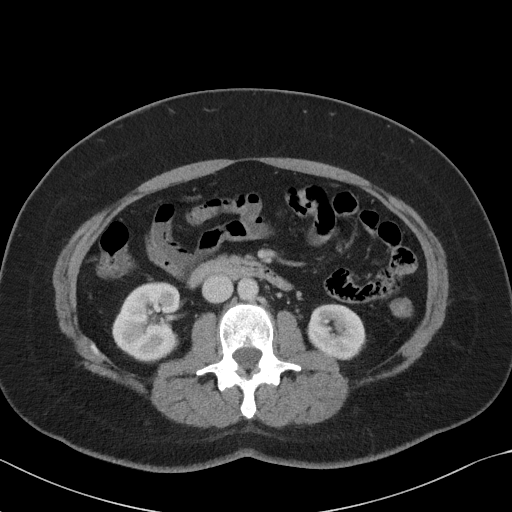
[im 57/90  bone]
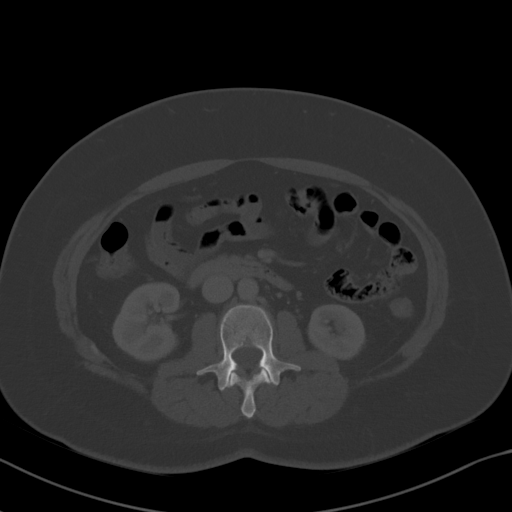
[im 66/90  soft-tissue]
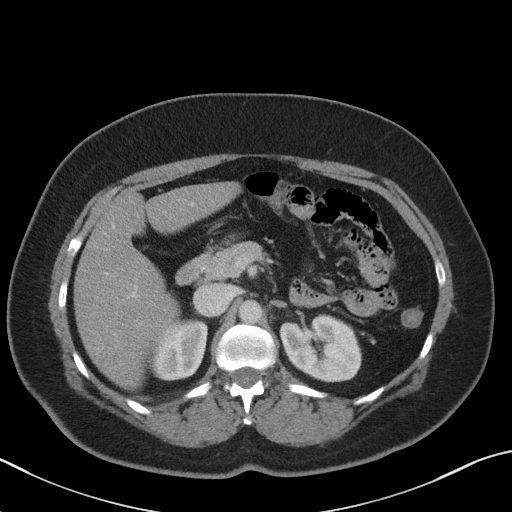
[im 71/90  soft-tissue]
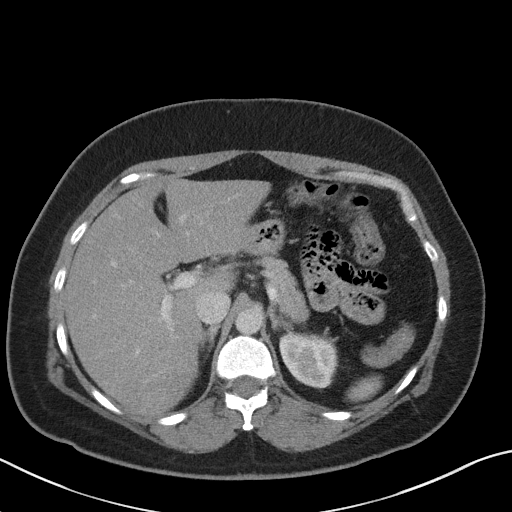
[im 75/90  soft-tissue]
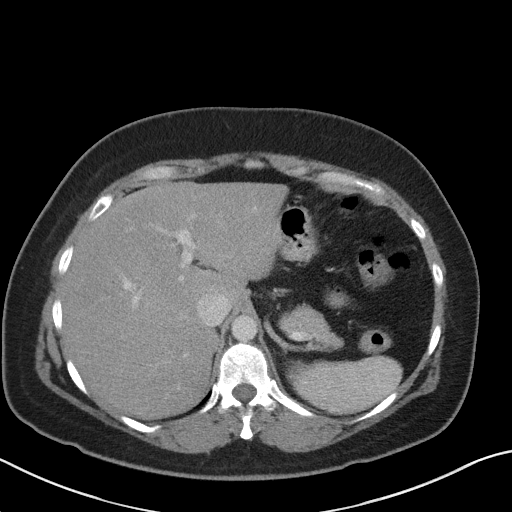
[im 85/90  soft-tissue]
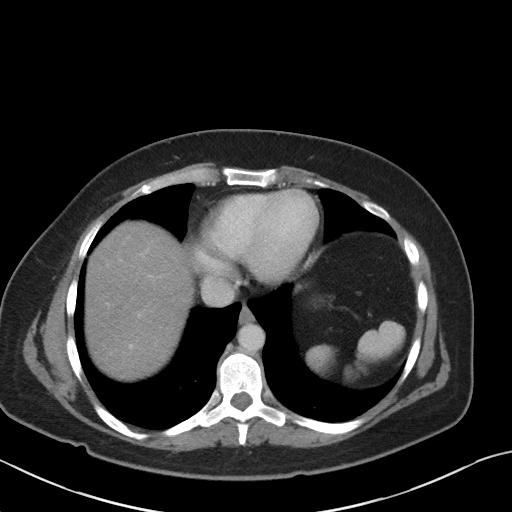

[Series 5: coronal st · coronal · 0.78mm/px · 3 of 90 slices shown]
[im 30/90  soft-tissue]
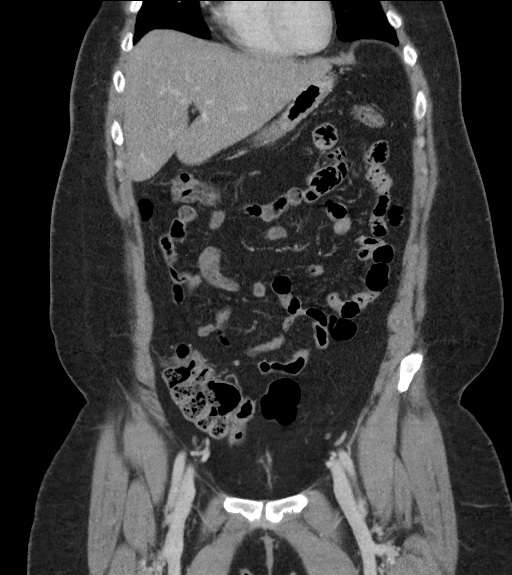
[im 40/90  soft-tissue]
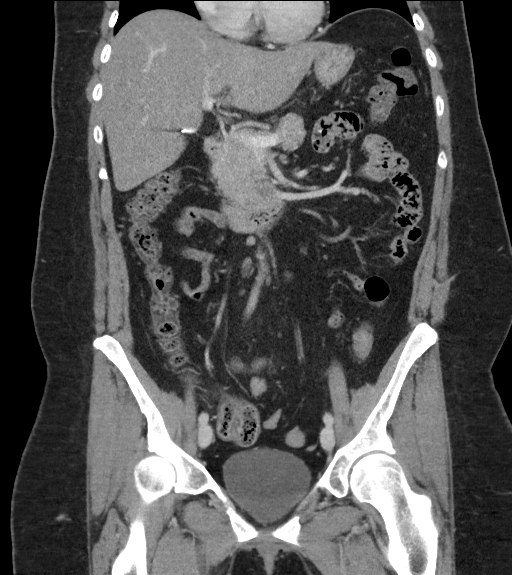
[im 50/90  soft-tissue]
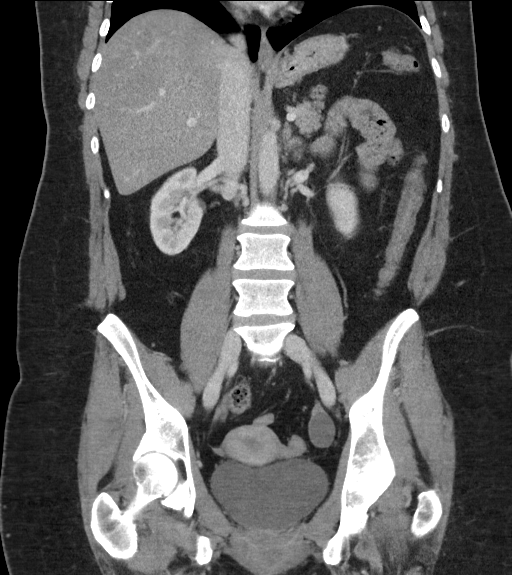

[16 of 46 positions shown; findings below may reference images not displayed]

FINDINGS: Lower chest: Questionable tiny nodule at RIGHT lung base 4 mm
diameter image 3, partially visualized on previous exam image 20.
Lung bases otherwise clear.

Hepatobiliary: Mild fatty infiltration of liver. Gallbladder
surgically absent. 6 mm low-attenuation nodule anterior liver image
19, by coronal and sagittal images unchanged

Pancreas: Normal appearance

Spleen: Normal appearance

Adrenals/Urinary Tract: Adrenal glands, kidneys, ureters, and
bladder normal appearance

Stomach/Bowel: Normal appendix. Bowel loops grossly normal
appearance for exam lacking GI contrast. Stomach decompressed, with
suboptimal assessment of proximal gastric wall thickness due to
underdistention.

Vascular/Lymphatic: Vascular structures unremarkable. No adenopathy.

Reproductive: Uterus and RIGHT ovary unremarkable. Small cyst in
LEFT ovary 2.5 x 2.0 x 2.8 cm image 68 with a dependent small
fluid-fluid level question hemorrhage versus debris

Other: No free air or free fluid. No hernia or acute inflammatory
process.

Musculoskeletal: Grade 1 anterolisthesis L5-S1 with pseudo disc
secondary to BILATERAL spondylolysis L5.
IMPRESSION: 4 mm RIGHT lower lobe nodule, partially visualized on prior exam,
stability uncertain; followup CT imaging recommended in 6 months to
reassess.

Mild fatty infiltration of liver with stable 6 mm low-attenuation
liver nodule.

Small mildly complicated LEFT ovarian cyst with a dependent
fluid-fluid level

## 2017-06-24 ENCOUNTER — Encounter: Payer: Self-pay | Admitting: Family Medicine

## 2017-06-24 ENCOUNTER — Ambulatory Visit (INDEPENDENT_AMBULATORY_CARE_PROVIDER_SITE_OTHER): Payer: Medicaid Other | Admitting: Family Medicine

## 2017-06-24 ENCOUNTER — Other Ambulatory Visit: Payer: Self-pay

## 2017-06-24 VITALS — BP 112/76 | HR 80 | Temp 97.4°F | Resp 18 | Ht 66.0 in | Wt 188.0 lb

## 2017-06-24 DIAGNOSIS — R05 Cough: Secondary | ICD-10-CM | POA: Diagnosis not present

## 2017-06-24 DIAGNOSIS — R059 Cough, unspecified: Secondary | ICD-10-CM

## 2017-06-24 MED ORDER — AZITHROMYCIN 250 MG PO TABS: ORAL_TABLET | ORAL | 0 refills | Status: DC

## 2017-06-24 MED ORDER — LEVOTHYROXINE SODIUM 50 MCG PO TABS: 50.0000 ug | ORAL_TABLET | Freq: Every day | ORAL | 3 refills | Status: DC

## 2017-06-24 NOTE — Progress Notes (Signed)
Chief Complaint  Patient presents with  . Cough    x 2 weeks   Patient is here for cough for 2 weeks.  She is coughing up yellow-green sputum.  She is exposed to a grandson with RSV.  She feels very tired.  Her chest is tired of coughing.  The cough keeps her awake at night.  No sore throat.  No sinus pressure or postnasal drip.  No ear pressure or pain.  No headaches.  No dizziness.  No exposure to pneumonia.  No history of underlying lung disease or COPD.  Patient Active Problem List   Diagnosis Date Noted  . Retinitis pigmentosa 12/10/2016  . Hypothyroidism 12/10/2016  . Visual impairment 12/10/2016  . Obesity (BMI 30-39.9) 08/13/2016  . Breast cancer ([place])   . Enterocolitis 11/25/2015  . S/P mastectomy, bilateral 09/13/2015    Outpatient Encounter Medications as of 06/24/2017  Medication Sig  . ibuprofen (ADVIL,MOTRIN) 800 MG tablet TK 1 T PO Q 8 H PRF PAIN  . levothyroxine (SYNTHROID, LEVOTHROID) 50 MCG tablet Take 1 tablet (50 mcg total) by mouth daily before breakfast.  . [DISCONTINUED] levothyroxine (SYNTHROID, LEVOTHROID) 50 MCG tablet Take 1 tablet (50 mcg total) by mouth daily before breakfast.  . azithromycin (ZITHROMAX) 250 MG tablet tad   No facility-administered encounter medications on file as of 06/24/2017.     Allergies  Allergen Reactions  . Sulfamethoxazole-Trimethoprim Rash    Review of Systems  Constitutional: Positive for fatigue. Negative for activity change, appetite change, chills, fever and unexpected weight change.  HENT: Negative for congestion, dental problem, postnasal drip and rhinorrhea.   Eyes: Positive for visual disturbance. Negative for redness.       Legally blind  Respiratory: Positive for cough. Negative for shortness of breath.        Chest is sore from coughing  Cardiovascular: Negative for chest pain, palpitations and leg swelling.  Gastrointestinal: Negative for abdominal pain, constipation and diarrhea.  Genitourinary: Negative  for difficulty urinating, frequency and menstrual problem.  Musculoskeletal: Positive for back pain. Negative for arthralgias.       Occasional back soreness  Neurological: Negative for dizziness and headaches.  Psychiatric/Behavioral: Positive for sleep disturbance. Negative for dysphoric mood. The patient is not nervous/anxious.       BP 112/76 (BP Location: Left Arm, Patient Position: Sitting, Cuff Size: Normal)   Pulse 80   Temp (!) 97.4 F (36.3 C) (Temporal)   Resp 18   Ht 5\' 6"  (1.676 m)   Wt 188 lb 0.6 oz (85.3 kg)   SpO2 99%   BMI 30.35 kg/m   Physical Exam  Constitutional: She is oriented to person, place, and time. She appears well-developed and well-nourished. No distress.  HENT:  Head: Normocephalic and atraumatic.  Mouth/Throat: Oropharynx is clear and moist.  Eyes: Conjunctivae are normal. Pupils are equal, round, and reactive to light.  Pedunculated skin tag right upper eyelid  Neck: Normal range of motion. No thyromegaly present.  Cardiovascular: Normal rate, regular rhythm and normal heart sounds.  Pulmonary/Chest: Effort normal and breath sounds normal.  Lungs are clear.  No chest wall tenderness  Abdominal: Soft.  obese  Musculoskeletal: Normal range of motion. She exhibits edema.  trace  Lymphadenopathy:    She has no cervical adenopathy.  Neurological: She is alert and oriented to person, place, and time.  Psychiatric: She has a normal mood and affect. Her behavior is normal. Thought content normal.    ASSESSMENT/PLAN:  1. Cough  in adult Discussed that this is likely a viral bronchitis.  Because this persisted longer than 10 days, and she is developed new symptoms of fatigue and chest pain I will give her a Z-Pak.  We discussed symptomatic care.  Fluids.  Over-the-counter cough medicine.   Patient Instructions  May continue the cough  Medicine like robutussin or mucinex Take the antibiotic as directed  Make a return appointment for removal of  the skin tag   Raylene Everts, MD

## 2017-06-24 NOTE — Patient Instructions (Signed)
May continue the cough  Medicine like robutussin or mucinex Take the antibiotic as directed  Make a return appointment for removal of the skin tag

## 2017-07-01 ENCOUNTER — Telehealth: Payer: Self-pay | Admitting: Family Medicine

## 2017-07-01 NOTE — Telephone Encounter (Signed)
Pt states she has seen some improvement, but the morning, her throat is so sore, and scratchy , but the afternoon it gets better, should she come in ... She has an appt for Monday -but concerned with the bad weather coming. [phone]

## 2017-07-01 NOTE — Telephone Encounter (Signed)
Her appt Monday is for a skin tag removal?

## 2017-07-01 NOTE — Telephone Encounter (Signed)
She was just seen in the office and Dr Meda Coffee, gave her medicine but she is not any better, Yes, Her appointment Monday is for a Skin Tag Removal but she is calling in as she is not better, please call [phone]

## 2017-07-01 NOTE — Telephone Encounter (Signed)
I explained to her when I saw her that I thought she probably had a viral bronchitis.  I gave her an antibiotic only because it had persisted longer than 10 days.  There is no promise that it was going to fix her problem.  She needs to continue with symptomatic care.  It is not unusual to continue to have symptoms from viral bronchitis for 4 weeks.

## 2017-07-02 ENCOUNTER — Other Ambulatory Visit: Payer: Self-pay

## 2017-07-02 ENCOUNTER — Ambulatory Visit (INDEPENDENT_AMBULATORY_CARE_PROVIDER_SITE_OTHER): Payer: Medicaid Other | Admitting: Family Medicine

## 2017-07-02 ENCOUNTER — Ambulatory Visit: Payer: Medicaid Other | Admitting: Family Medicine

## 2017-07-02 ENCOUNTER — Encounter: Payer: Self-pay | Admitting: Family Medicine

## 2017-07-02 VITALS — BP 110/82 | HR 80 | Temp 98.5°F | Resp 20 | Ht 66.0 in | Wt 191.0 lb

## 2017-07-02 DIAGNOSIS — J020 Streptococcal pharyngitis: Secondary | ICD-10-CM

## 2017-07-02 LAB — POCT RAPID STREP A (OFFICE): Rapid Strep A Screen: POSITIVE — AB

## 2017-07-02 MED ORDER — PROMETHAZINE-DM 6.25-15 MG/5ML PO SYRP: 5.0000 mL | ORAL_SOLUTION | Freq: Four times a day (QID) | ORAL | 0 refills | Status: DC | PRN

## 2017-07-02 MED ORDER — PENICILLIN V POTASSIUM 500 MG PO TABS: ORAL_TABLET | ORAL | 0 refills | Status: DC

## 2017-07-02 NOTE — Patient Instructions (Addendum)
Take the penicillin as directed  Re schedule your visit for Monday   Sore Throat When you have a sore throat, your throat may:  Hurt.  Burn.  Feel irritated.  Feel scratchy.  Many things can cause a sore throat, including:  An infection.  Allergies.  Dryness in the air.  Smoke or pollution.  Gastroesophageal reflux disease (GERD).  A tumor.  A sore throat can be the first sign of another sickness. It can happen with other problems, like coughing or a fever. Most sore throats go away without treatment. Follow these instructions at home:  Take over-the-counter medicines only as told by your doctor.  Drink enough fluids to keep your pee (urine) clear or pale yellow.  Rest when you feel you need to.  To help with pain, try: ? Sipping warm liquids, such as broth, herbal tea, or warm water. ? Eating or drinking cold or frozen liquids, such as frozen ice pops. ? Gargling with a salt-water mixture 3-4 times a day or as needed. To make a salt-water mixture, add -1 tsp of salt in 1 cup of warm water. Mix it until you cannot see the salt anymore. ? Sucking on hard candy or throat lozenges. ? Putting a cool-mist humidifier in your bedroom at night. ? Sitting in the bathroom with the door closed for 5-10 minutes while you run hot water in the shower.  Do not use any tobacco products, such as cigarettes, chewing tobacco, and e-cigarettes. If you need help quitting, ask your doctor. Contact a doctor if:  You have a fever for more than 2-3 days.  You keep having symptoms for more than 2-3 days.  Your throat does not get better in 7 days.  You have a fever and your symptoms suddenly get worse. Get help right away if:  You have trouble breathing.  You cannot swallow fluids, soft foods, or your saliva.  You have swelling in your throat or neck that gets worse.  You keep feeling like you are going to throw up (vomit).  You keep throwing up. This information is not  intended to replace advice given to you by your health care provider. Make sure you discuss any questions you have with your health care provider. Document Released: 03/17/2008 Document Revised: 02/02/2016 Document Reviewed: 03/29/2015 Elsevier Interactive Patient Education  [employer].

## 2017-07-02 NOTE — Progress Notes (Signed)
Chief Complaint  Patient presents with  . Cough   Patient is here for follow-up of her upper respiratory infection.  She still has cough.  She has sore throat.  These persistent symptoms of been going on for couple weeks it is very painful in the morning.  No fever or chills.  No nausea or vomiting.  No runny or stuffy nose.  No headaches.  No exposure to strep or respiratory infection.  As as documented previously the only exposure she had was her grandson with RSV.  Patient Active Problem List   Diagnosis Date Noted  . Retinitis pigmentosa 12/10/2016  . Hypothyroidism 12/10/2016  . Visual impairment 12/10/2016  . Obesity (BMI 30-39.9) 08/13/2016  . Breast cancer ([place])   . Enterocolitis 11/25/2015  . S/P mastectomy, bilateral 09/13/2015    Outpatient Encounter Medications as of 07/02/2017  Medication Sig  . ibuprofen (ADVIL,MOTRIN) 800 MG tablet TK 1 T PO Q 8 H PRF PAIN  . levothyroxine (SYNTHROID, LEVOTHROID) 50 MCG tablet Take 1 tablet (50 mcg total) by mouth daily before breakfast.  . penicillin v potassium (VEETID) 500 MG tablet Take one tab 3 times a day  . promethazine-dextromethorphan (PROMETHAZINE-DM) 6.25-15 MG/5ML syrup Take 5 mLs by mouth 4 (four) times daily as needed for cough.  . [DISCONTINUED] azithromycin (ZITHROMAX) 250 MG tablet tad (Patient not taking: Reported on 07/02/2017)   No facility-administered encounter medications on file as of 07/02/2017.     Allergies  Allergen Reactions  . Sulfamethoxazole-Trimethoprim Rash    Review of Systems  Constitutional: Positive for fatigue. Negative for activity change, appetite change, chills, fever and unexpected weight change.  HENT: Positive for sore throat. Negative for congestion, dental problem, postnasal drip and rhinorrhea.   Eyes: Positive for visual disturbance. Negative for redness.       Legally blind  Respiratory: Positive for cough. Negative for shortness of breath.        Chest is sore from coughing    Cardiovascular: Negative for chest pain, palpitations and leg swelling.  Gastrointestinal: Negative for abdominal pain, constipation and diarrhea.  Genitourinary: Negative for difficulty urinating, frequency and menstrual problem.  Musculoskeletal: Positive for back pain. Negative for arthralgias.       Occasional back soreness  Neurological: Negative for dizziness and headaches.  Psychiatric/Behavioral: Positive for sleep disturbance. Negative for dysphoric mood. The patient is not nervous/anxious.     BP 110/82 (BP Location: Right Arm, Patient Position: Sitting, Cuff Size: Large)   Pulse 80   Temp 98.5 F (36.9 C) (Temporal)   Resp 20   Ht 5\' 6"  (1.676 m)   Wt 191 lb (86.6 kg)   SpO2 100%   BMI 30.83 kg/m   Physical Exam  Constitutional: She is oriented to person, place, and time. She appears well-developed and well-nourished. No distress.  HENT:  Head: Normocephalic and atraumatic.  Right Ear: External ear normal.  Left Ear: External ear normal.  Mouth/Throat: Oropharynx is clear and moist. No oropharyngeal exudate.  Eyes: Conjunctivae are normal. Pupils are equal, round, and reactive to light.  Pedunculated skin tag right upper eyelid  Neck: Normal range of motion. No thyromegaly present.  Cardiovascular: Normal rate, regular rhythm and normal heart sounds.  Pulmonary/Chest: Effort normal and breath sounds normal.  Lungs are clear.  No chest wall tenderness  Abdominal: Soft.  obese  Musculoskeletal: Normal range of motion. She exhibits edema.  trace  Lymphadenopathy:    She has no cervical adenopathy.  Neurological: She is  alert and oriented to person, place, and time.  Psychiatric: She has a normal mood and affect. Her behavior is normal. Thought content normal.    ASSESSMENT/PLAN:  1. Strep pharyngitis Results for orders placed or performed in visit on 07/02/17  POCT rapid strep A  Result Value Ref Range   Rapid Strep A Screen Positive (A) Negative    -  POCT rapid strep A   Patient Instructions  Take the penicillin as directed  Re schedule your visit for Monday   Sore Throat When you have a sore throat, your throat may:  Hurt.  Burn.  Feel irritated.  Feel scratchy.  Many things can cause a sore throat, including:  An infection.  Allergies.  Dryness in the air.  Smoke or pollution.  Gastroesophageal reflux disease (GERD).  A tumor.  A sore throat can be the first sign of another sickness. It can happen with other problems, like coughing or a fever. Most sore throats go away without treatment. Follow these instructions at home:  Take over-the-counter medicines only as told by your doctor.  Drink enough fluids to keep your pee (urine) clear or pale yellow.  Rest when you feel you need to.  To help with pain, try: ? Sipping warm liquids, such as broth, herbal tea, or warm water. ? Eating or drinking cold or frozen liquids, such as frozen ice pops. ? Gargling with a salt-water mixture 3-4 times a day or as needed. To make a salt-water mixture, add -1 tsp of salt in 1 cup of warm water. Mix it until you cannot see the salt anymore. ? Sucking on hard candy or throat lozenges. ? Putting a cool-mist humidifier in your bedroom at night. ? Sitting in the bathroom with the door closed for 5-10 minutes while you run hot water in the shower.  Do not use any tobacco products, such as cigarettes, chewing tobacco, and e-cigarettes. If you need help quitting, ask your doctor. Contact a doctor if:  You have a fever for more than 2-3 days.  You keep having symptoms for more than 2-3 days.  Your throat does not get better in 7 days.  You have a fever and your symptoms suddenly get worse. Get help right away if:  You have trouble breathing.  You cannot swallow fluids, soft foods, or your saliva.  You have swelling in your throat or neck that gets worse.  You keep feeling like you are going to throw up (vomit).  You  keep throwing up. This information is not intended to replace advice given to you by your health care provider. Make sure you discuss any questions you have with your health care provider. Document Released: 03/17/2008 Document Revised: 02/02/2016 Document Reviewed: 03/29/2015 Elsevier Interactive Patient Education  2018 [employer] Inc.    Raylene Everts, MD

## 2017-07-02 NOTE — Telephone Encounter (Signed)
Seen today at 1100.

## 2017-07-05 ENCOUNTER — Ambulatory Visit: Payer: Medicaid Other | Admitting: Family Medicine

## 2017-07-09 ENCOUNTER — Ambulatory Visit (INDEPENDENT_AMBULATORY_CARE_PROVIDER_SITE_OTHER): Payer: Medicaid Other | Admitting: Family Medicine

## 2017-07-09 ENCOUNTER — Other Ambulatory Visit: Payer: Self-pay

## 2017-07-09 ENCOUNTER — Encounter: Payer: Self-pay | Admitting: Family Medicine

## 2017-07-09 ENCOUNTER — Other Ambulatory Visit (HOSPITAL_COMMUNITY)
Admission: RE | Admit: 2017-07-09 | Discharge: 2017-07-09 | Disposition: A | Discharge status: Home / Self Care | Payer: Medicaid Other | Source: Ambulatory Visit | Attending: Family Medicine | Admitting: Family Medicine

## 2017-07-09 VITALS — BP 116/74 | HR 84 | Temp 97.8°F | Resp 16 | Ht 66.0 in | Wt 192.1 lb

## 2017-07-09 DIAGNOSIS — L989 Disorder of the skin and subcutaneous tissue, unspecified: Secondary | ICD-10-CM | POA: Insufficient documentation

## 2017-07-09 DIAGNOSIS — H3552 Pigmentary retinal dystrophy: Secondary | ICD-10-CM | POA: Diagnosis not present

## 2017-07-09 NOTE — Patient Instructions (Signed)
Watch for infection - redness, pain , drainage  Referred to local eye doctor  See me on usual schedule  I will contact you with the pathology report

## 2017-07-09 NOTE — Progress Notes (Signed)
Here for skin lesion removal growing on the right eyelid for years It is big enough to interfere with opening eye completely Also states she has an  Aggressive" form of cancer and wants this checked BP 116/74 (BP Location: Left Arm, Patient Position: Sitting, Cuff Size: Normal)   Pulse 84   Temp 97.8 F (36.6 C) (Temporal)   Resp 16   Ht 5\' 6"  (1.676 m)   Wt 192 lb 1.9 oz (87.1 kg)   SpO2 98%   BMI 31.01 kg/m    The right eyelid has a pedunculated verrucous looking skin growth on a stalk, maximum diameter 6 mm Timeout Consent Cleansed with Betadine Lesion removed sharply with scissors Lesion sent for analysis to pathology Bleeding controlled with cold compress and pressure Small amount bacitracin ointment applied Postop instructions discussed  Lesion of skin of face - Plan: Dermatology pathology, Removal of skin tags  Retinitis pigmentosa - Plan: Ambulatory referral to Ophthalmology   Patient Instructions  Watch for infection - redness, pain , drainage  Referred to local eye doctor  See me on usual schedule  I will contact you with the pathology report

## 2017-07-13 ENCOUNTER — Telehealth: Payer: Self-pay | Admitting: Family Medicine

## 2017-07-13 NOTE — Telephone Encounter (Signed)
Patient called back to update her phone # [phone]

## 2017-07-15 ENCOUNTER — Encounter: Payer: Self-pay | Admitting: Family Medicine

## 2017-07-15 ENCOUNTER — Other Ambulatory Visit: Payer: Self-pay

## 2017-07-15 ENCOUNTER — Ambulatory Visit (INDEPENDENT_AMBULATORY_CARE_PROVIDER_SITE_OTHER): Payer: Medicaid Other | Admitting: Family Medicine

## 2017-07-15 VITALS — BP 130/94 | HR 84 | Temp 98.4°F | Resp 16 | Wt 191.8 lb

## 2017-07-15 DIAGNOSIS — J020 Streptococcal pharyngitis: Secondary | ICD-10-CM

## 2017-07-15 LAB — POCT RAPID STREP A (OFFICE): Rapid Strep A Screen: POSITIVE — AB

## 2017-07-15 MED ORDER — AZITHROMYCIN 250 MG PO TABS: ORAL_TABLET | ORAL | 0 refills | Status: DC

## 2017-07-15 MED ORDER — CEFTRIAXONE SODIUM 500 MG IJ SOLR
1000.0000 mg | Freq: Once | INTRAMUSCULAR | Status: AC
  Administered 2017-07-15: 1000 mg via INTRAMUSCULAR

## 2017-07-15 MED ORDER — CEFTRIAXONE SODIUM 1 G IJ SOLR: 1.0000 g | Freq: Once | INTRAMUSCULAR | 0 refills | Status: DC

## 2017-07-15 NOTE — Progress Notes (Signed)
Patient ID: Kathryn Skinner, female    DOB: Dec 16, 1970, 47 y.o.   MRN: [id number]  Chief Complaint  Patient presents with  . Sore Throat    tested positive at last OV, has been on 3 abx in last 2 months, states she is feeling worse.     Allergies Sulfamethoxazole-trimethoprim  Subjective:   Kathryn Skinner is a 47 y.o. female who presents to [facility] today.  HPI Here to follow up b/c of sore throat. Has been treated for bronchitis and strep throat. Has been on amoxicllin for bronchitis, then on zpack for cough/bronchitis. Then was seen for strep and treated with PCN. Is on PCN at this time and woke up today with sore throat. No fever. Voice hoarse. No body aches. Coughing for about a month. Cough is getting better. Reports that these are the exact same symptoms when has strep. Does not smoke. No recent sick contacts. Appetite is good.    Sore Throat   This is a recurrent problem. The current episode started today. The problem has been unchanged. The pain is worse on the right side. There has been no fever. The pain is moderate. Associated symptoms include congestion, a hoarse voice and swollen glands. Pertinent negatives include no abdominal pain, diarrhea, drooling, ear discharge, ear pain, headaches, plugged ear sensation, neck pain, shortness of breath, stridor, trouble swallowing or vomiting. She has had no exposure to strep or mono. She has tried NSAIDs for the symptoms. The treatment provided mild relief.    Past Medical History:  Diagnosis Date  . Allergy   . Eye disease   . Fibromyalgia   . Retinitis pigmentosa   . 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 Thyroid disease    hypothyroid    Past Surgical History:  Procedure Laterality Date  . BREAST SURGERY    . CHOLECYSTECTOMY    . COLONOSCOPY N/A 11/27/2015   Procedure: COLONOSCOPY;  Surgeon: Gatha Mayer, MD;  Location: [place];  Service: Endoscopy;  Laterality: N/A;  .  MASTECTOMY Bilateral 2017  . PORT-A-CATH REMOVAL    . PORTACATH PLACEMENT      Family History  Problem Relation Age of Onset  . Thyroid disease Mother   . Thyroid disease Sister   . Asthma Sister   . Thyroid disease Maternal Grandmother   . Emphysema Maternal Grandmother   . Cancer Maternal Grandmother        lung  . Alcohol abuse Maternal Grandfather   . Cancer Maternal Grandfather        pancreatic  . Liver disease Maternal Aunt      Social History   Socioeconomic History  . Marital status: Divorced    Spouse name: None  . Number of children: 1  . Years of education: 15  . Highest education level: None  Social Needs  . Financial resource strain: None  . Food insecurity - worry: None  . Food insecurity - inability: None  . Transportation needs - medical: None  . Transportation needs - non-medical: None  Occupational History  . Occupation: disabled    Comment: vision impairment  Tobacco Use  . Smoking status: Never Smoker  . Smokeless tobacco: Never Used  . Tobacco comment: N/I  Substance and Sexual Activity  . Alcohol use: No  . Drug use: No  . Sexual activity: Not Currently    Birth control/protection: None, Post-menopausal  Other Topics Concern  . None  Social History Narrative  Lives at home BF   Vision impaired since age 45, has eye disease that disables from work   Does not Nurse, learning disability is 50, daughter, with new baby 2018    Review of Systems  HENT: Positive for congestion and hoarse voice. Negative for drooling, ear discharge, ear pain and trouble swallowing.   Respiratory: Negative for shortness of breath and stridor.   Gastrointestinal: Negative for abdominal pain, diarrhea and vomiting.  Musculoskeletal: Negative for neck pain.  Neurological: Negative for headaches.     Objective:   BP (!) 130/94 (BP Location: Left Arm, Patient Position: Sitting, Cuff Size: Normal)   Pulse 84   Temp 98.4 F (36.9 C) (Temporal)   Resp 16   Wt 191 lb 12  oz (87 kg)   SpO2 99%   BMI 30.95 kg/m   Physical Exam  Constitutional: She is oriented to person, place, and time. She appears well-developed and well-nourished.  Non-toxic appearance. She does not appear ill.  HENT:  Head: Normocephalic and atraumatic.  Right Ear: Tympanic membrane and ear canal normal.  Left Ear: Tympanic membrane and ear canal normal.  Mouth/Throat: Uvula is midline and mucous membranes are normal. No oral lesions. No uvula swelling. Posterior oropharyngeal erythema present. No oropharyngeal exudate, posterior oropharyngeal edema or tonsillar abscesses. Tonsils are 1+ on the right. Tonsils are 0 on the left. No tonsillar exudate.  Eyes: EOM are normal. Pupils are equal, round, and reactive to light.  Neck: Normal range of motion. Neck supple.  Cardiovascular: Normal rate and regular rhythm.  Lymphadenopathy:    She has no cervical adenopathy.  Neurological: She is alert and oriented to person, place, and time.  Skin: Skin is warm and dry. No rash noted.  Psychiatric: She has a normal mood and affect. Her behavior is normal.     Assessment and Plan  1. Pharyngitis due to Streptococcus species Patient presenting with probable recurrent strep infection.  Currently on penicillin at this time.  At this time will discontinue penicillin and give her an injection of Rocephin today.  Tomorrow she is to start the Zithromax as directed.  She was counseled concerning worrisome signs and symptoms of sore throat and throat pain. Will plan on culturing her strep test today.  Will inform patient of results. She will plan on following up with her PCP in 10 days to 2 weeks for recheck of strep test. She will use Advil over-the-counter as needed. Continue fluids, hydration, salt water gargles as needed. - POCT rapid strep A - azithromycin (ZITHROMAX) 250 MG tablet; Take two pills for the first day and one pill a day for the remaining four days.  Dispense: 6 tablet; Refill: 0 -  cefTRIAXone (ROCEPHIN) injection 1,000 mg - Culture, Group A Strep  -Plan on rechecking blood pressure at follow-up visit due to elevated diastolic reading today. Return in about 2 weeks (around 07/29/2017), or if symptoms worsen or fail to improve, for Follow up with PCP. Caren Macadam, MD 07/15/2017

## 2017-07-19 ENCOUNTER — Telehealth: Payer: Self-pay | Admitting: Family Medicine

## 2017-07-19 NOTE — Telephone Encounter (Signed)
Please advise patient to continue her medications as directed.  We are still waiting on the results of the culture.  I will check today and see if they come back.

## 2017-07-19 NOTE — Telephone Encounter (Signed)
Called patient regarding message below. No answer, left generic message for patient to return call.   

## 2017-07-19 NOTE — Telephone Encounter (Signed)
Please advise. Culture was sent out the day it was collected.

## 2017-07-19 NOTE — Telephone Encounter (Signed)
Patient called in stating she still isnt feeling better, she is also requesting results from strep test  Cb#: [phone]

## 2017-07-20 LAB — CULTURE, GROUP A STREP
MICRO NUMBER:: 90114998
SPECIMEN QUALITY:: ADEQUATE

## 2017-07-21 NOTE — Telephone Encounter (Signed)
Patient notified of culture results per results note from Dr. Mannie Stabile

## 2017-07-23 ENCOUNTER — Other Ambulatory Visit: Payer: Self-pay

## 2017-07-23 ENCOUNTER — Emergency Department (HOSPITAL_COMMUNITY): Payer: Medicaid Other

## 2017-07-23 ENCOUNTER — Encounter (HOSPITAL_COMMUNITY): Payer: Self-pay | Admitting: *Deleted

## 2017-07-23 ENCOUNTER — Emergency Department (HOSPITAL_COMMUNITY)
Admission: EM | Admit: 2017-07-23 | Discharge: 2017-07-23 | Disposition: A | Discharge status: Home / Self Care | Payer: Medicaid Other | Attending: Emergency Medicine | Admitting: Emergency Medicine

## 2017-07-23 DIAGNOSIS — H25043 Posterior subcapsular polar age-related cataract, bilateral: Secondary | ICD-10-CM | POA: Diagnosis not present

## 2017-07-23 DIAGNOSIS — E039 Hypothyroidism, unspecified: Secondary | ICD-10-CM | POA: Diagnosis not present

## 2017-07-23 DIAGNOSIS — B349 Viral infection, unspecified: Secondary | ICD-10-CM | POA: Insufficient documentation

## 2017-07-23 DIAGNOSIS — Z79899 Other long term (current) drug therapy: Secondary | ICD-10-CM | POA: Insufficient documentation

## 2017-07-23 DIAGNOSIS — R079 Chest pain, unspecified: Secondary | ICD-10-CM | POA: Diagnosis not present

## 2017-07-23 DIAGNOSIS — R0789 Other chest pain: Secondary | ICD-10-CM | POA: Diagnosis not present

## 2017-07-23 DIAGNOSIS — R05 Cough: Secondary | ICD-10-CM | POA: Diagnosis present

## 2017-07-23 DIAGNOSIS — H3552 Pigmentary retinal dystrophy: Secondary | ICD-10-CM | POA: Diagnosis not present

## 2017-07-23 LAB — I-STAT BETA HCG BLOOD, ED (MC, WL, AP ONLY): I-stat hCG, quantitative: <5 m[IU]/mL (ref <5)

## 2017-07-23 LAB — BASIC METABOLIC PANEL
Anion gap: 10 (ref 5–15)
BUN: 16 mg/dL (ref 6–20)
CO2: 23 mmol/L (ref 22–32)
CREATININE: 0.81 mg/dL (ref 0.44–1.00)
Calcium: 8.9 mg/dL (ref 8.9–10.3)
Chloride: 105 mmol/L (ref 101–111)
GFR calc Af Amer: >60 mL/min (ref >60)
GLUCOSE: 95 mg/dL (ref 65–99)
Potassium: 5 mmol/L (ref 3.5–5.1)
SODIUM: 138 mmol/L (ref 135–145)

## 2017-07-23 LAB — CBC WITH DIFFERENTIAL/PLATELET
Basophils Absolute: 0 10*3/uL (ref 0.0–0.1)
Basophils Relative: 0 %
EOS PCT: 2 %
Eosinophils Absolute: 0.1 10*3/uL (ref 0.0–0.7)
HEMATOCRIT: 40.4 % (ref 36.0–46.0)
Hemoglobin: 13.4 g/dL (ref 12.0–15.0)
LYMPHS ABS: 2.9 10*3/uL (ref 0.7–4.0)
LYMPHS PCT: 34 %
MCH: 29.6 pg (ref 26.0–34.0)
MCHC: 33.2 g/dL (ref 30.0–36.0)
MCV: 89.2 fL (ref 78.0–100.0)
MONO ABS: 0.9 10*3/uL (ref 0.1–1.0)
Monocytes Relative: 10 %
NEUTROS ABS: 4.6 10*3/uL (ref 1.7–7.7)
Neutrophils Relative %: 54 %
Platelets: 263 10*3/uL (ref 150–400)
RBC: 4.53 MIL/uL (ref 3.87–5.11)
RDW: 14.1 % (ref 11.5–15.5)
WBC: 8.6 10*3/uL (ref 4.0–10.5)

## 2017-07-23 LAB — I-STAT TROPONIN, ED
TROPONIN I, POC: 0 ng/mL (ref 0.00–0.08)
Troponin i, poc: 0 ng/mL (ref 0.00–0.08)

## 2017-07-23 MED ORDER — FLUTICASONE PROPIONATE 50 MCG/ACT NA SUSP: 1.0000 | Freq: Every day | NASAL | 2 refills | Status: DC

## 2017-07-23 MED ORDER — ASPIRIN 81 MG PO CHEW
324.0000 mg | CHEWABLE_TABLET | Freq: Once | ORAL | Status: AC
  Administered 2017-07-23: 324 mg via ORAL
  Filled 2017-07-23: qty 4

## 2017-07-23 MED ORDER — BENZONATATE 100 MG PO CAPS: 100.0000 mg | ORAL_CAPSULE | Freq: Three times a day (TID) | ORAL | 0 refills | Status: DC | PRN

## 2017-07-23 NOTE — ED Provider Notes (Signed)
[facility] EMERGENCY DEPARTMENT Provider Note   CSN: [id number] Arrival date & time: 07/23/17  1324     History   Chief Complaint Chief Complaint  Patient presents with  . Cough    HPI Kathryn Skinner is a 47 y.o. female  who presents to the ED complaining of URI sxs intermittently for past 2 months. Reports she has congestion, rhinorrhea, bilateral ear pressure, and productive cough with green mucous sputum.  She has also had some mild dyspnea with exertion with the associated URI symptoms for the past 2 months as well. Patient states she has been prescribed and completed treatment with Penicillin and with Azithromycin twice without significant improvement. States she has had an unknown cough medicine without codeine which she has taken without relief. Received a shot of a steroid 2 weeks prior also without improvement. No other specific alleviating or aggravating factors. Denies fevers, chills, sore throat, or wheezing.   Upon further inquiry patient states she is also having intermittent chest pain x 1 week- occurs with  Coughing and with activities such as walking around the house. She states she will sometimes have the dyspnea with the chest discomfort, however the dyspnea has been ongoing with URI sxs for 2 months which she has seen PCP for. Pain is substernal, sharp in nature, does not radiate.  States she feels there is "crud" in her lungs relating to this. There is no associated nausea,  Vomiting, or diaphoresis. No hx of CAD. No family hx of cardiac event at an early age. No hx of tobacco abuse. Pertinent risk factors include obesity. Denies leg pain/swelling, hemoptysis, recent surgery/trauma, recent long travel, hormone use, or personal hx of DVT/PE. Patient has hx of breast cancer- not currently in treatment and has not been for several years.    HPI  Past Medical History:  Diagnosis Date  . Allergy   . Eye disease   . Fibromyalgia   . Retinitis  pigmentosa   . 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 Thyroid disease    hypothyroid    Patient Active Problem List   Diagnosis Date Noted  . Retinitis pigmentosa 12/10/2016  . Hypothyroidism 12/10/2016  . Visual impairment 12/10/2016  . Obesity (BMI 30-39.9) 08/13/2016  . Breast cancer ([place])   . Enterocolitis 11/25/2015  . S/P mastectomy, bilateral 09/13/2015    Past Surgical History:  Procedure Laterality Date  . BREAST SURGERY    . CHOLECYSTECTOMY    . COLONOSCOPY N/A 11/27/2015   Procedure: COLONOSCOPY;  Surgeon: Gatha Mayer, MD;  Location: [facility];  Service: Endoscopy;  Laterality: N/A;  . MASTECTOMY Bilateral 2017  . PORT-A-CATH REMOVAL    . PORTACATH PLACEMENT      OB History    Gravida Para Term Preterm AB Living   2 1     1 1    SAB TAB Ectopic Multiple Live Births   1       1       Home Medications    Prior to Admission medications   Medication Sig Start Date End Date Taking? Authorizing Provider  azithromycin (ZITHROMAX) 250 MG tablet Take two pills for the first day and one pill a day for the remaining four days. 07/15/17   Caren Macadam, MD  ibuprofen (ADVIL,MOTRIN) 800 MG tablet TK 1 T PO Q 8 H PRF PAIN 04/22/17   [provider]  levothyroxine (SYNTHROID, LEVOTHROID) 50 MCG tablet Take 1 tablet (50 mcg total)  by mouth daily before breakfast. 06/24/17   Raylene Everts, MD  penicillin v potassium (VEETID) 500 MG tablet Take one tab 3 times a day 07/02/17   Raylene Everts, MD  promethazine-dextromethorphan (PROMETHAZINE-DM) 6.25-15 MG/5ML syrup Take 5 mLs by mouth 4 (four) times daily as needed for cough. 07/02/17   Raylene Everts, MD    Family History Family History  Problem Relation Age of Onset  . Thyroid disease Mother   . Thyroid disease Sister   . Asthma Sister   . Thyroid disease Maternal Grandmother   . Emphysema Maternal Grandmother   . Cancer Maternal Grandmother        lung  . Alcohol abuse Maternal  Grandfather   . Cancer Maternal Grandfather        pancreatic  . Liver disease Maternal Aunt     Social History Social History   Tobacco Use  . Smoking status: Never Smoker  . Smokeless tobacco: Never Used  . Tobacco comment: N/I  Substance Use Topics  . Alcohol use: No  . Drug use: No     Allergies   Sulfamethoxazole-trimethoprim   Review of Systems Review of Systems  Constitutional: Negative for chills and fever.  HENT: Positive for congestion, ear pain and rhinorrhea. Negative for sore throat.   Respiratory: Positive for cough and shortness of breath. Negative for wheezing.   Cardiovascular: Positive for chest pain. Negative for palpitations and leg swelling.  Gastrointestinal: Negative for abdominal pain, constipation, diarrhea, nausea and vomiting.  Genitourinary: Negative for dysuria.  Neurological: Negative for dizziness, weakness, light-headedness, numbness and headaches.  All other systems reviewed and are negative.  Physical Exam Updated Vital Signs BP (!) 143/98 (BP Location: Right Arm)   Pulse 98   Temp 97.9 F (36.6 C) (Oral)   Resp 18   SpO2 100%   Physical Exam  Constitutional: She appears well-developed and well-nourished. No distress.  HENT:  Head: Normocephalic and atraumatic.  Right Ear: Tympanic membrane is not perforated, not erythematous, not retracted and not bulging.  Left Ear: Tympanic membrane is not perforated, not erythematous, not retracted and not bulging.  Nose: Mucosal edema present. Right sinus exhibits no maxillary sinus tenderness and no frontal sinus tenderness. Left sinus exhibits no maxillary sinus tenderness and no frontal sinus tenderness.  Mouth/Throat: Uvula is midline and oropharynx is clear and moist. No oropharyngeal exudate or posterior oropharyngeal erythema.  Eyes: Conjunctivae are normal. Pupils are equal, round, and reactive to light. Right eye exhibits no discharge. Left eye exhibits no discharge.  Neck: Normal  range of motion. Neck supple.  Cardiovascular: Normal rate and regular rhythm.  No murmur heard. Pulses:      Radial pulses are 2+ on the right side, and 2+ on the left side.  Pulmonary/Chest: Breath sounds normal. No respiratory distress. She has no wheezes. She has no rales.  Abdominal: Soft. She exhibits no distension. There is no tenderness.  Musculoskeletal: She exhibits no edema or tenderness.  Lymphadenopathy:    She has no cervical adenopathy.  Neurological: She is alert.  Skin: Skin is warm and dry. No rash noted.  Psychiatric: She has a normal mood and affect. Her behavior is normal.  Nursing note and vitals reviewed.   ED Treatments / Results  Labs Results for orders placed or performed during the hospital encounter of 16/10/96  Basic metabolic panel  Result Value Ref Range   Sodium 138 135 - 145 mmol/L   Potassium 5.0 3.5 - 5.1 mmol/L  Chloride 105 101 - 111 mmol/L   CO2 23 22 - 32 mmol/L   Glucose, Bld 95 65 - 99 mg/dL   BUN 16 6 - 20 mg/dL   Creatinine, Ser 0.81 0.44 - 1.00 mg/dL   Calcium 8.9 8.9 - 10.3 mg/dL   GFR calc non Af Amer >60 >60 mL/min   GFR calc Af Amer >60 >60 mL/min   Anion gap 10 5 - 15  CBC with Differential/Platelet  Result Value Ref Range   WBC 8.6 4.0 - 10.5 K/uL   RBC 4.53 3.87 - 5.11 MIL/uL   Hemoglobin 13.4 12.0 - 15.0 g/dL   HCT 40.4 36.0 - 46.0 %   MCV 89.2 78.0 - 100.0 fL   MCH 29.6 26.0 - 34.0 pg   MCHC 33.2 30.0 - 36.0 g/dL   RDW 14.1 11.5 - 15.5 %   Platelets 263 150 - 400 K/uL   Neutrophils Relative % 54 %   Neutro Abs 4.6 1.7 - 7.7 K/uL   Lymphocytes Relative 34 %   Lymphs Abs 2.9 0.7 - 4.0 K/uL   Monocytes Relative 10 %   Monocytes Absolute 0.9 0.1 - 1.0 K/uL   Eosinophils Relative 2 %   Eosinophils Absolute 0.1 0.0 - 0.7 K/uL   Basophils Relative 0 %   Basophils Absolute 0.0 0.0 - 0.1 K/uL  I-stat troponin, ED (0, 3)  Result Value Ref Range   Troponin i, poc 0.00 0.00 - 0.08 ng/mL   Comment 3          I-Stat beta  hCG blood, ED  Result Value Ref Range   I-stat hCG, quantitative <5.0 <5 mIU/mL   Comment 3           EKG  EKG Interpretation  Date/Time:  Friday July 23 2017 15:37:13 EST Ventricular Rate:  90 PR Interval:  134 QRS Duration: 66 QT Interval:  360 QTC Calculation: 440 R Axis:   56 Text Interpretation:  Normal sinus rhythm Normal ECG since last tracing no significant change Confirmed by Malvin Johns [phone]) on 07/23/2017 5:31:35 PM       Radiology Dg Chest 2 View  Result Date: 07/23/2017 CLINICAL DATA:  Productive cough. History of breast cancer with bilateral mastectomies. EXAM: CHEST  2 VIEW COMPARISON:  09/24/2016. FINDINGS: Normal sized heart. Clear lungs. Mild scoliosis and minimal thoracic spine degenerative changes. Post bilateral mastectomy changes. IMPRESSION: No acute abnormality. Electronically Signed   By: Claudie Revering M.D.   On: 07/23/2017 17:11    Procedures Procedures (including critical care time)  Medications Ordered in ED Medications  aspirin chewable tablet 324 mg (324 mg Oral Given 07/23/17 1610)     Initial Impression / Assessment and Plan / ED Course  I have reviewed the triage vital signs and the nursing notes.  Pertinent labs & imaging results that were available during my care of the patient were reviewed by me and considered in my medical decision making (see chart for details).   Patient presents with URI sxs and chest discomfort. Patient is nontoxic appearing, in no apparent distress, initially tachycardic upon arrival, however this improved upon my evaluation. Given patient's hx of chest discomfort- cardiac work-up initiated including EKG, CXR, screening labs, as well as serial troponin values.   EKG without significant ST/T wave changes. Initial troponin negative. Screening labs grossly unremarkable- of note there is no anemia, leukocytosis, or significant electrolyte abnormality.   Pain is not pleuritic in nature, patient is without leg  pain/swelling, hemoptysis,  recent surgery/trauma, recent long travel, hormone use, or recent tx for cancer- doubt pulmonary embolism. Patient's pain is not tearing in nature, 2+ symmetric radial pulses, no widening of the mediastinum on CXR, doubt dissection. URI sxs appear consistent with a viral illness. Patient is afebrile and without adventitious sounds on lung exam, CXR negative for infiltrate, doubt PNA. No wheezing on exam. Afebrile, no sinus tenderness, doubt sinusitis. Centor score 0, doubt strep pharyngitis. Suspect viral etiology. Upon chart review patient had chest discomfort at PCP visit 01/04, suspect possibly related to ongoing URI sxs. As discussed above no significant ST/T wave changes on EKG, EKG appears unchanged from previous. Initial troponin negative. Patient with a Heart Score of 2, Heart path score of 2. Will obtain repeat troponin at 3 hours, if negative anticipate discharge home with PCP and cardiology follow up.   Patient signed out pending repeat troponin to [employer].    Final Clinical Impressions(s) / ED Diagnoses   Final diagnoses:  Chest pain, unspecified type  Viral illness    ED Discharge Orders    None       Amaryllis Dyke, PA-C 07/23/17 1821    Malvin Johns, MD 07/23/17 2035

## 2017-07-23 NOTE — Discharge Instructions (Signed)
You were seen in the emergency today and diagnosed with chest pain and a viral illness.  Your lab work did not show any problems with your heart, kidneys, or electrolytes. You were not anemic. Your EKG was normal and similar to previous you have had done. Your Chest x-ray did not show signs of pneumonia.   We have prescribed you multiple medications to treat your symptoms:   -Flonase to be used 1 spray in each nostril daily.  This medication is used to treat your congestion.  -Tessalon can be taken once every 8 hours as needed.  This medication is used to treat your cough.   You will need to follow up with cardiology group in your discharge instructions within the next 5 days for re-evaluation.   You will need to follow-up with your primary care provider in 1 week if your overall symptoms have not improved.  Return to the emergency department for any new or worsening symptoms.

## 2017-07-23 NOTE — ED Triage Notes (Signed)
Pt reports dx of strep throat twice this month, with completed treatment reported, pt c/o productive cough, with green phlegm, pt denies sore throat, pt denies fever & chills, A&O x4, hx of CA

## 2017-07-23 NOTE — ED Notes (Signed)
Patient transported to X-ray 

## 2017-08-30 ENCOUNTER — Encounter: Payer: Self-pay | Admitting: Family Medicine

## 2017-09-30 ENCOUNTER — Ambulatory Visit: Payer: Medicaid Other | Admitting: Family Medicine

## 2017-11-07 ENCOUNTER — Emergency Department (HOSPITAL_COMMUNITY): Payer: Medicaid Other

## 2017-11-07 ENCOUNTER — Emergency Department (HOSPITAL_COMMUNITY)
Admission: EM | Admit: 2017-11-07 | Discharge: 2017-11-07 | Disposition: A | Discharge status: Home / Self Care | Payer: Medicaid Other | Attending: Emergency Medicine | Admitting: Emergency Medicine

## 2017-11-07 ENCOUNTER — Encounter (HOSPITAL_COMMUNITY): Payer: Self-pay | Admitting: Emergency Medicine

## 2017-11-07 ENCOUNTER — Other Ambulatory Visit: Payer: Self-pay

## 2017-11-07 DIAGNOSIS — T63461A Toxic effect of venom of wasps, accidental (unintentional), initial encounter: Secondary | ICD-10-CM

## 2017-11-07 DIAGNOSIS — L03113 Cellulitis of right upper limb: Secondary | ICD-10-CM

## 2017-11-07 DIAGNOSIS — T63481A Toxic effect of venom of other arthropod, accidental (unintentional), initial encounter: Secondary | ICD-10-CM | POA: Diagnosis not present

## 2017-11-07 DIAGNOSIS — Z79899 Other long term (current) drug therapy: Secondary | ICD-10-CM | POA: Insufficient documentation

## 2017-11-07 DIAGNOSIS — E039 Hypothyroidism, unspecified: Secondary | ICD-10-CM | POA: Diagnosis not present

## 2017-11-07 DIAGNOSIS — T63441A Toxic effect of venom of bees, accidental (unintentional), initial encounter: Secondary | ICD-10-CM

## 2017-11-07 DIAGNOSIS — R21 Rash and other nonspecific skin eruption: Secondary | ICD-10-CM | POA: Diagnosis present

## 2017-11-07 DIAGNOSIS — T63451A Toxic effect of venom of hornets, accidental (unintentional), initial encounter: Secondary | ICD-10-CM

## 2017-11-07 MED ORDER — DIPHENHYDRAMINE HCL 25 MG PO CAPS
25.0000 mg | ORAL_CAPSULE | Freq: Once | ORAL | Status: AC
Start: 2017-11-07 — End: 2017-11-07
  Administered 2017-11-07: 25 mg via ORAL
  Filled 2017-11-07: qty 1

## 2017-11-07 MED ORDER — PREDNISONE 10 MG PO TABS
60.0000 mg | ORAL_TABLET | Freq: Once | ORAL | Status: AC
  Administered 2017-11-07: 16:00:00 60 mg via ORAL
  Filled 2017-11-07: qty 1

## 2017-11-07 MED ORDER — CLINDAMYCIN HCL 150 MG PO CAPS: ORAL_CAPSULE | ORAL | 0 refills | Status: DC

## 2017-11-07 MED ORDER — FAMOTIDINE 20 MG PO TABS
40.0000 mg | ORAL_TABLET | Freq: Once | ORAL | Status: AC
  Administered 2017-11-07: 40 mg via ORAL
  Filled 2017-11-07: qty 2

## 2017-11-07 MED ORDER — PREDNISONE 20 MG PO TABS: 40.0000 mg | ORAL_TABLET | Freq: Every day | ORAL | 0 refills | Status: DC

## 2017-11-07 NOTE — Discharge Instructions (Signed)
Take the prescriptions as directed.  Take over the counter benadryl, as directed on packaging, as needed for itching.  If the benadryl is too sedating, take an over the counter non-sedating antihistamine such as claritin, allegra or zyrtec, as directed on packaging.  Apply ice to the area(s) of discomfort, for 15 minutes at a time, several times per day for the next few days.  Do not fall asleep on an ice pack. Call your regular medical doctor on Monday to schedule a follow up appointment within the next 2 to 3 days.  Return to the Emergency Department immediately sooner if worsening.

## 2017-11-07 NOTE — ED Triage Notes (Signed)
Patient states that she was stung by a ?wasp yesterday and now has swelling and itching to right upper arm. Patient reports taking benadryl 2 hours prior to coming to ED with no relief. Denies any difficulty breathing or swallowing but states "I just don't feel good and now I'm dizzy."

## 2017-11-07 NOTE — ED Provider Notes (Signed)
[facility] Provider Note   CSN: [id number] Arrival date & time: 11/07/17  1408     History   Chief Complaint Chief Complaint  Patient presents with  . Insect Bite    HPI Kathryn Skinner is a 47 y.o. female.  HPI Pt was seen at 1430. Per pt, c/o gradual onset and worsening of persistent "red itchy rash" on her RUE that began yesterday. Pt states she was stung by a wasp yesterday at that site. Pt states the area has progressively become "more red and itchy" since onset. Pt took one OTC benadryl tab approximately 2 hours PTA. Pt states she now "feels dizzy" (lightheaded) since taking the benadryl. Denies dysphagia/sore throat, no intra-oral edema, no SOB, no wheezing/stridor, no abd pain, no N/V/D, no fevers, no other areas of rash.    Past Medical History:  Diagnosis Date  . Allergy   . Eye disease   . Fibromyalgia   . Retinitis pigmentosa   . 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 Thyroid disease    hypothyroid    Patient Active Problem List   Diagnosis Date Noted  . Retinitis pigmentosa 12/10/2016  . Hypothyroidism 12/10/2016  . Visual impairment 12/10/2016  . Obesity (BMI 30-39.9) 08/13/2016  . Breast cancer ([place])   . Enterocolitis 11/25/2015  . S/P mastectomy, bilateral 09/13/2015    Past Surgical History:  Procedure Laterality Date  . BREAST SURGERY    . CHOLECYSTECTOMY    . COLONOSCOPY N/A 11/27/2015   Procedure: COLONOSCOPY;  Surgeon: Gatha Mayer, MD;  Location: [facility];  Service: Endoscopy;  Laterality: N/A;  . MASTECTOMY Bilateral 2017  . PORT-A-CATH REMOVAL    . PORTACATH PLACEMENT       OB History    Gravida  2   Para  1   Term      Preterm      AB  1   Living  1     SAB  1   TAB      Ectopic      Multiple      Live Births  1            Home Medications    Prior to Admission medications   Medication Sig Start Date End Date Taking? Authorizing Provider  azithromycin  (ZITHROMAX) 250 MG tablet Take two pills for the first day and one pill a day for the remaining four days. 07/15/17   Caren Macadam, MD  benzonatate (TESSALON) 100 MG capsule Take 1 capsule (100 mg total) by mouth 3 (three) times daily as needed for cough. 07/23/17   Petrucelli, Samantha R, PA-C  fluticasone (FLONASE) 50 MCG/ACT nasal spray Place 1 spray into both nostrils daily. 07/23/17   Petrucelli, Samantha R, PA-C  ibuprofen (ADVIL,MOTRIN) 800 MG tablet TK 1 T PO Q 8 H PRF PAIN 04/22/17   [provider]  levothyroxine (SYNTHROID, LEVOTHROID) 50 MCG tablet Take 1 tablet (50 mcg total) by mouth daily before breakfast. 06/24/17   Raylene Everts, MD  penicillin v potassium (VEETID) 500 MG tablet Take one tab 3 times a day 07/02/17   Raylene Everts, MD  promethazine-dextromethorphan (PROMETHAZINE-DM) 6.25-15 MG/5ML syrup Take 5 mLs by mouth 4 (four) times daily as needed for cough. 07/02/17   Raylene Everts, MD    Family History Family History  Problem Relation Age of Onset  . Thyroid disease Mother   . Thyroid disease Sister   .  Asthma Sister   . Thyroid disease Maternal Grandmother   . Emphysema Maternal Grandmother   . Cancer Maternal Grandmother        lung  . Alcohol abuse Maternal Grandfather   . Cancer Maternal Grandfather        pancreatic  . Liver disease Maternal Aunt     Social History Social History   Tobacco Use  . Smoking status: Never Smoker  . Smokeless tobacco: Never Used  . Tobacco comment: N/I  Substance Use Topics  . Alcohol use: No  . Drug use: No     Allergies   Sulfamethoxazole-trimethoprim   Review of Systems Review of Systems ROS: Statement: All systems negative except as marked or noted in the HPI; Constitutional: Negative for fever and chills. ; ; Eyes: Negative for eye pain, redness and discharge. ; ; ENMT: Negative for ear pain, hoarseness, nasal congestion, sinus pressure and sore throat. ; ; Cardiovascular: Negative for chest  pain, palpitations, diaphoresis, dyspnea and peripheral edema. ; ; Respiratory: Negative for cough, wheezing and stridor. ; ; Gastrointestinal: Negative for nausea, vomiting, diarrhea, abdominal pain, blood in stool, hematemesis, jaundice and rectal bleeding. . ; ; Genitourinary: Negative for dysuria, flank pain and hematuria. ; ; Musculoskeletal: Negative for back pain and neck pain. Negative for swelling and trauma.; ; Skin: +itching rash. Negative for abrasions, blisters, bruising and skin lesion.; ; Neuro: +lightheadedness. Negative for headache and neck stiffness. Negative for weakness, altered level of consciousness, altered mental status, extremity weakness, paresthesias, involuntary movement, seizure and syncope.       Physical Exam Updated Vital Signs BP 129/86 (BP Location: Left Arm)   Pulse (S) (!) 110   Temp 97.9 F (36.6 C) (Oral)   Resp 16   Ht 5' 4.5" (1.638 m)   Wt 89.8 kg (198 lb)   SpO2 100%   BMI 33.46 kg/m    BP 120/77 (BP Location: Left Arm)   Pulse (!) 101   Temp 98.3 F (36.8 C) (Oral)   Resp 18   Ht 5' 4.5" (1.638 m)   Wt 89.8 kg (198 lb)   SpO2 100%   BMI 33.46 kg/m    Physical Exam 1435: Physical examination:  Nursing notes reviewed; Vital signs and O2 SAT reviewed;  Constitutional: Well developed, Well nourished, Well hydrated, In no acute distress. Drinking fluids on my arrival to exam room.; Head:  Normocephalic, atraumatic; Eyes: EOMI, PERRL, No scleral icterus; ENMT: Mouth and pharynx normal, Mucous membranes moist. No hoarse voice, no drooling, no stridor.; Neck: Supple, Full range of motion, No lymphadenopathy; Cardiovascular: Regular rate and rhythm, No gallop; Respiratory: Breath sounds clear & equal bilaterally, No wheezes.  Speaking full sentences with ease, Normal respiratory effort/excursion; Chest: Nontender, Movement normal; Abdomen: Soft, Nontender, Nondistended, Normal bowel sounds; Genitourinary: No CVA tenderness; Extremities: +right upper  inner arm with large area of erythema, warmth, and induration; punctate wound near center of area, no drainage, no fluctuance, no central pointing area, no soft tissue crepitus, no streaking.  Peripheral pulses normal, No deformity.; Neuro: AA&Ox3, Major CN grossly intact.  Speech clear. No gross focal motor or sensory deficits in extremities.; Skin: Color normal, Warm, Dry.   ED Treatments / Results  Labs (all labs ordered are listed, but only abnormal results are displayed)   EKG None  Radiology   Procedures Procedures (including critical care time)  Medications Ordered in ED Medications  predniSONE (DELTASONE) tablet 60 mg (has no administration in time range)  diphenhydrAMINE (BENADRYL) capsule  25 mg (has no administration in time range)  famotidine (PEPCID) tablet 40 mg (has no administration in time range)     Initial Impression / Assessment and Plan / ED Course  I have reviewed the triage vital signs and the nursing notes.  Pertinent labs & imaging results that were available during my care of the patient were reviewed by me and considered in my medical decision making (see chart for details).  MDM Reviewed: previous chart, nursing note and vitals Interpretation: x-ray   Dg Humerus Right Result Date: 11/07/2017 CLINICAL DATA:  Bee sting yesterday to right mid upper arm with worsening swelling and redness today. EXAM: RIGHT HUMERUS - 2+ VIEW COMPARISON:  None. FINDINGS: Bony structures joint spaces and soft tissues are within normal. IMPRESSION: Negative. Electronically Signed   By: Marin Olp M.D.   On: 11/07/2017 15:34    1715:  Rash appears local reaction to insect sting. Pt has received benadryl, prednisone, pepcid with improvement in itching and erythema. Resps continue easy, lungs CTA bilat. Pt has tol PO well while in the ED without N/V. Pt has ambulated with steady gait. States she feels better and wants to go home now. Continue to tx symptomatically, f/u PMD.  Dx and testing d/w pt.  Questions answered.  Verb understanding, agreeable to d/c home with outpt f/u.    Final Clinical Impressions(s) / ED Diagnoses   Final diagnoses:  None    ED Discharge Orders    None       Francine Graven, DO 11/12/17 0626

## 2017-11-07 NOTE — ED Notes (Signed)
Pt ambulated to BR

## 2017-11-07 NOTE — ED Notes (Signed)
Right inner upper arm with redness, warmth and swelling

## 2017-12-27 DIAGNOSIS — R7989 Other specified abnormal findings of blood chemistry: Secondary | ICD-10-CM | POA: Diagnosis not present

## 2017-12-27 DIAGNOSIS — Z7689 Persons encountering health services in other specified circumstances: Secondary | ICD-10-CM | POA: Diagnosis not present

## 2018-01-06 ENCOUNTER — Ambulatory Visit: Payer: Medicaid Other | Admitting: Family Medicine

## 2018-03-03 DIAGNOSIS — Z6832 Body mass index (BMI) 32.0-32.9, adult: Secondary | ICD-10-CM | POA: Diagnosis not present

## 2018-03-03 DIAGNOSIS — Z171 Estrogen receptor negative status [ER-]: Secondary | ICD-10-CM | POA: Diagnosis not present

## 2018-03-03 DIAGNOSIS — Z853 Personal history of malignant neoplasm of breast: Secondary | ICD-10-CM | POA: Diagnosis not present

## 2018-03-03 DIAGNOSIS — E6609 Other obesity due to excess calories: Secondary | ICD-10-CM | POA: Diagnosis not present

## 2018-03-03 DIAGNOSIS — M898X9 Other specified disorders of bone, unspecified site: Secondary | ICD-10-CM | POA: Diagnosis not present

## 2018-03-03 DIAGNOSIS — E669 Obesity, unspecified: Secondary | ICD-10-CM | POA: Diagnosis not present

## 2018-03-03 DIAGNOSIS — H3552 Pigmentary retinal dystrophy: Secondary | ICD-10-CM | POA: Diagnosis not present

## 2018-03-03 DIAGNOSIS — Z9189 Other specified personal risk factors, not elsewhere classified: Secondary | ICD-10-CM | POA: Diagnosis not present

## 2018-03-03 DIAGNOSIS — R202 Paresthesia of skin: Secondary | ICD-10-CM | POA: Diagnosis not present

## 2018-03-03 DIAGNOSIS — Z6831 Body mass index (BMI) 31.0-31.9, adult: Secondary | ICD-10-CM | POA: Diagnosis not present

## 2018-03-03 DIAGNOSIS — Z7689 Persons encountering health services in other specified circumstances: Secondary | ICD-10-CM | POA: Diagnosis not present

## 2018-03-03 DIAGNOSIS — C50211 Malignant neoplasm of upper-inner quadrant of right female breast: Secondary | ICD-10-CM | POA: Diagnosis not present

## 2018-03-16 DIAGNOSIS — M19071 Primary osteoarthritis, right ankle and foot: Secondary | ICD-10-CM | POA: Diagnosis not present

## 2018-03-16 DIAGNOSIS — M19072 Primary osteoarthritis, left ankle and foot: Secondary | ICD-10-CM | POA: Diagnosis not present

## 2018-03-16 DIAGNOSIS — Z853 Personal history of malignant neoplasm of breast: Secondary | ICD-10-CM | POA: Diagnosis not present

## 2018-03-16 DIAGNOSIS — Z7689 Persons encountering health services in other specified circumstances: Secondary | ICD-10-CM | POA: Diagnosis not present

## 2018-03-16 DIAGNOSIS — M898X9 Other specified disorders of bone, unspecified site: Secondary | ICD-10-CM | POA: Diagnosis not present

## 2018-03-16 DIAGNOSIS — Z171 Estrogen receptor negative status [ER-]: Secondary | ICD-10-CM | POA: Diagnosis not present

## 2018-03-16 DIAGNOSIS — C50211 Malignant neoplasm of upper-inner quadrant of right female breast: Secondary | ICD-10-CM | POA: Diagnosis not present

## 2018-03-16 DIAGNOSIS — M898X8 Other specified disorders of bone, other site: Secondary | ICD-10-CM | POA: Diagnosis not present

## 2018-03-24 DIAGNOSIS — M898X9 Other specified disorders of bone, unspecified site: Secondary | ICD-10-CM | POA: Diagnosis not present

## 2018-03-24 DIAGNOSIS — C50211 Malignant neoplasm of upper-inner quadrant of right female breast: Secondary | ICD-10-CM | POA: Diagnosis not present

## 2018-03-24 DIAGNOSIS — Z7689 Persons encountering health services in other specified circumstances: Secondary | ICD-10-CM | POA: Diagnosis not present

## 2018-03-24 DIAGNOSIS — Z9189 Other specified personal risk factors, not elsewhere classified: Secondary | ICD-10-CM | POA: Diagnosis not present

## 2018-03-24 DIAGNOSIS — E6609 Other obesity due to excess calories: Secondary | ICD-10-CM | POA: Diagnosis not present

## 2018-03-24 DIAGNOSIS — Z9013 Acquired absence of bilateral breasts and nipples: Secondary | ICD-10-CM | POA: Diagnosis not present

## 2018-03-24 DIAGNOSIS — Z171 Estrogen receptor negative status [ER-]: Secondary | ICD-10-CM | POA: Diagnosis not present

## 2018-03-24 DIAGNOSIS — M1990 Unspecified osteoarthritis, unspecified site: Secondary | ICD-10-CM | POA: Diagnosis not present

## 2018-03-24 DIAGNOSIS — Z6832 Body mass index (BMI) 32.0-32.9, adult: Secondary | ICD-10-CM | POA: Diagnosis not present

## 2018-03-27 ENCOUNTER — Encounter (HOSPITAL_COMMUNITY): Payer: Self-pay | Admitting: *Deleted

## 2018-03-27 ENCOUNTER — Emergency Department (HOSPITAL_COMMUNITY)
Admission: EM | Admit: 2018-03-27 | Discharge: 2018-03-27 | Disposition: A | Discharge status: Home / Self Care | Payer: Medicaid Other | Attending: Emergency Medicine | Admitting: Emergency Medicine

## 2018-03-27 DIAGNOSIS — R21 Rash and other nonspecific skin eruption: Secondary | ICD-10-CM | POA: Insufficient documentation

## 2018-03-27 DIAGNOSIS — E039 Hypothyroidism, unspecified: Secondary | ICD-10-CM | POA: Diagnosis not present

## 2018-03-27 DIAGNOSIS — Z79899 Other long term (current) drug therapy: Secondary | ICD-10-CM | POA: Diagnosis not present

## 2018-03-27 MED ORDER — TRIAMCINOLONE ACETONIDE 0.1 % EX CREA: 1.0000 "application " | TOPICAL_CREAM | Freq: Two times a day (BID) | CUTANEOUS | 0 refills | Status: DC

## 2018-03-27 NOTE — Discharge Instructions (Addendum)
You were evaluated in the emergency department for a new rash.  I am not sure the cause of your rash but we have you start using a topical steroid to the areas that are affected.  You should also use Benadryl 1 to 2 tablets as needed every 6 hours for itching.  Gentle soap and water cleansing.  Please follow-up with your doctor.

## 2018-03-27 NOTE — ED Triage Notes (Signed)
Pt with red areas to back of right upper arm, left arm, top right foot and left ankle that burn and itch. First noted 2 weeks ago.  Denies fever.

## 2018-03-27 NOTE — ED Provider Notes (Signed)
[facility] EMERGENCY DEPARTMENT Provider Note   CSN: [id number] Arrival date & time: 03/27/18  1428     History   Chief Complaint Chief Complaint  Patient presents with  . Skin Problem    HPI Kathryn Skinner is a 47 y.o. female.  Is presenting to the emergency department with an itchy red rash that has been going on and off for 2 weeks.  It started on her left ankle and left thigh they were patches approximately 2 inches.  Those went away but now she has new ones on her right ankle and right upper arm.  She said the initial one blistered a little bit.  It is itchy and burns.  She otherwise has no systemic symptoms no fever no cough no vomiting or diarrhea.  Other people in the house do not have any similar kind of rash.  She said she has not been doing any yard work.  No new soaps or contacts.  She tried to do a bath with some Clorox bleach poured and it yesterday  The history is provided by the patient.  Rash   This is a new problem. The current episode started more than 1 week ago. The problem has not changed since onset.The problem is associated with nothing. There has been no fever. The rash is present on the right ankle and right arm. The patient is experiencing no pain. Associated symptoms include itching. Associated symptoms comments: burning. She has tried nothing for the symptoms. The treatment provided no relief.    Past Medical History:  Diagnosis Date  . Allergy   . Eye disease   . Fibromyalgia   . Retinitis pigmentosa   . 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 Thyroid disease    hypothyroid    Patient Active Problem List   Diagnosis Date Noted  . Retinitis pigmentosa 12/10/2016  . Hypothyroidism 12/10/2016  . Visual impairment 12/10/2016  . Obesity (BMI 30-39.9) 08/13/2016  . Breast cancer ([place])   . Enterocolitis 11/25/2015  . S/P mastectomy, bilateral 09/13/2015    Past Surgical History:  Procedure Laterality Date  . BREAST SURGERY    .  CHOLECYSTECTOMY    . COLONOSCOPY N/A 11/27/2015   Procedure: COLONOSCOPY;  Surgeon: Gatha Mayer, MD;  Location: [place];  Service: Endoscopy;  Laterality: N/A;  . MASTECTOMY Bilateral 2017  . PORT-A-CATH REMOVAL    . PORTACATH PLACEMENT       OB History    Gravida  2   Para  1   Term      Preterm      AB  1   Living  1     SAB  1   TAB      Ectopic      Multiple      Live Births  1            Home Medications    Prior to Admission medications   Medication Sig Start Date End Date Taking? Authorizing Provider  azithromycin (ZITHROMAX) 250 MG tablet Take two pills for the first day and one pill a day for the remaining four days. 07/15/17   Caren Macadam, MD  clindamycin (CLEOCIN) 150 MG capsule 3 tabs PO TID x 10 days 11/07/17   Francine Graven, DO  diphenhydrAMINE (BENADRYL ALLERGY) 25 mg capsule Take 25 mg by mouth every 6 (six) hours as needed.    [provider]  ibuprofen (ADVIL,MOTRIN) 800 MG tablet TK 1  T PO Q 8 H PRF PAIN 04/22/17   [provider]  levothyroxine (SYNTHROID, LEVOTHROID) 50 MCG tablet Take 1 tablet (50 mcg total) by mouth daily before breakfast. 06/24/17   Raylene Everts, MD  penicillin v potassium (VEETID) 500 MG tablet Take one tab 3 times a day Patient not taking: Reported on 11/07/2017 07/02/17   Raylene Everts, MD  predniSONE (DELTASONE) 20 MG tablet Take 2 tablets (40 mg total) by mouth daily. 11/07/17   Francine Graven, DO    Family History Family History  Problem Relation Age of Onset  . Thyroid disease Mother   . Thyroid disease Sister   . Asthma Sister   . Thyroid disease Maternal Grandmother   . Emphysema Maternal Grandmother   . Cancer Maternal Grandmother        lung  . Alcohol abuse Maternal Grandfather   . Cancer Maternal Grandfather        pancreatic  . Liver disease Maternal Aunt     Social History Social History   Tobacco Use  . Smoking status: Never Smoker  . Smokeless  tobacco: Never Used  . Tobacco comment: N/I  Substance Use Topics  . Alcohol use: No  . Drug use: No     Allergies   Sulfamethoxazole-trimethoprim   Review of Systems Review of Systems  Constitutional: Negative for fever.  HENT: Negative for sore throat.   Eyes: Negative for visual disturbance.  Respiratory: Negative for shortness of breath.   Cardiovascular: Negative for chest pain.  Gastrointestinal: Negative for abdominal pain.  Genitourinary: Negative for dysuria.  Musculoskeletal: Negative for neck pain.  Skin: Positive for itching and rash. Negative for wound.  Neurological: Negative for headaches.     Physical Exam Updated Vital Signs BP 140/79 (BP Location: Left Arm)   Pulse 86   Temp 97.6 F (36.4 C) (Oral)   Resp 16   Ht 5\' 6"  (1.676 m)   Wt 88 kg   LMP 02/25/2018   SpO2 100%   BMI 31.31 kg/m   Physical Exam  Constitutional: She appears well-developed and well-nourished.  HENT:  Head: Normocephalic and atraumatic.  Eyes: Conjunctivae are normal.  Neck: Neck supple.  Cardiovascular: Normal rate, regular rhythm and normal heart sounds.  Pulmonary/Chest: Effort normal. No stridor. She has no wheezes.  Abdominal: Soft. There is no tenderness. There is no guarding.  Musculoskeletal: Normal range of motion. She exhibits no tenderness or deformity.  Neurological: She is alert. GCS eye subscore is 4. GCS verbal subscore is 5. GCS motor subscore is 6.  Skin: Skin is warm and dry.  She has 1 patch on her right ankle 3 cm that is red and slightly indurated.  There is another larger area on her right upper arm that looks more urticarial.  She says a prior area on her left ankle had blistered but that seems to be healed over.  There are no petechiae or bulla.  Psychiatric: She has a normal mood and affect.     ED Treatments / Results  Labs (all labs ordered are listed, but only abnormal results are displayed) Labs Reviewed - No data to  display  EKG None  Radiology No results found.  Procedures Procedures (including critical care time)  Medications Ordered in ED Medications - No data to display   Initial Impression / Assessment and Plan / ED Course  I have reviewed the triage vital signs and the nursing notes.  Pertinent labs & imaging results that were available  during my care of the patient were reviewed by me and considered in my medical decision making (see chart for details).  Clinical Course as of Mar 27 1830  Sun Oct 06, 313  [id number] 47 year old female with with a prior history of breast cancer but not currently on any chemotherapy here with a very atypical rash.  She otherwise does not have any systemic symptoms prudent to start her on a topical steroid and refer her back to her primary care doctor.   [MB]    Clinical Course User Index [MB] Hayden Rasmussen, MD     Final Clinical Impressions(s) / ED Diagnoses   Final diagnoses:  Rash and nonspecific skin eruption    ED Discharge Orders         Ordered    triamcinolone cream (KENALOG) 0.1 %  2 times daily     03/27/18 1457           Hayden Rasmussen, MD 03/27/18 1831

## 2018-03-31 DIAGNOSIS — Z7689 Persons encountering health services in other specified circumstances: Secondary | ICD-10-CM | POA: Diagnosis not present

## 2018-03-31 DIAGNOSIS — Z9189 Other specified personal risk factors, not elsewhere classified: Secondary | ICD-10-CM | POA: Diagnosis not present

## 2018-04-15 DIAGNOSIS — Z7689 Persons encountering health services in other specified circumstances: Secondary | ICD-10-CM | POA: Diagnosis not present

## 2018-04-15 DIAGNOSIS — H3552 Pigmentary retinal dystrophy: Secondary | ICD-10-CM | POA: Diagnosis not present

## 2018-04-29 DIAGNOSIS — Z01818 Encounter for other preprocedural examination: Secondary | ICD-10-CM | POA: Diagnosis not present

## 2018-04-29 DIAGNOSIS — H25041 Posterior subcapsular polar age-related cataract, right eye: Secondary | ICD-10-CM | POA: Diagnosis not present

## 2018-04-29 DIAGNOSIS — Z7689 Persons encountering health services in other specified circumstances: Secondary | ICD-10-CM | POA: Diagnosis not present

## 2018-05-04 DIAGNOSIS — H25041 Posterior subcapsular polar age-related cataract, right eye: Secondary | ICD-10-CM | POA: Diagnosis not present

## 2018-05-04 DIAGNOSIS — H2511 Age-related nuclear cataract, right eye: Secondary | ICD-10-CM | POA: Diagnosis not present

## 2018-05-04 DIAGNOSIS — H25811 Combined forms of age-related cataract, right eye: Secondary | ICD-10-CM | POA: Diagnosis not present

## 2018-05-12 DIAGNOSIS — Z7689 Persons encountering health services in other specified circumstances: Secondary | ICD-10-CM | POA: Diagnosis not present

## 2018-06-08 DIAGNOSIS — Z7689 Persons encountering health services in other specified circumstances: Secondary | ICD-10-CM | POA: Diagnosis not present

## 2018-06-08 DIAGNOSIS — H2512 Age-related nuclear cataract, left eye: Secondary | ICD-10-CM | POA: Diagnosis not present

## 2018-06-08 DIAGNOSIS — H25042 Posterior subcapsular polar age-related cataract, left eye: Secondary | ICD-10-CM | POA: Diagnosis not present

## 2018-07-02 ENCOUNTER — Encounter (HOSPITAL_COMMUNITY): Payer: Self-pay | Admitting: Emergency Medicine

## 2018-07-02 ENCOUNTER — Other Ambulatory Visit: Payer: Self-pay

## 2018-07-02 ENCOUNTER — Emergency Department (HOSPITAL_COMMUNITY)
Admission: EM | Admit: 2018-07-02 | Discharge: 2018-07-02 | Disposition: A | Discharge status: Home / Self Care | Payer: Medicaid Other | Attending: Emergency Medicine | Admitting: Emergency Medicine

## 2018-07-02 DIAGNOSIS — D0590 Unspecified type of carcinoma in situ of unspecified breast: Secondary | ICD-10-CM | POA: Diagnosis not present

## 2018-07-02 DIAGNOSIS — J069 Acute upper respiratory infection, unspecified: Secondary | ICD-10-CM | POA: Diagnosis not present

## 2018-07-02 DIAGNOSIS — E039 Hypothyroidism, unspecified: Secondary | ICD-10-CM | POA: Insufficient documentation

## 2018-07-02 DIAGNOSIS — Z79899 Other long term (current) drug therapy: Secondary | ICD-10-CM | POA: Insufficient documentation

## 2018-07-02 DIAGNOSIS — J029 Acute pharyngitis, unspecified: Secondary | ICD-10-CM | POA: Insufficient documentation

## 2018-07-02 LAB — GROUP A STREP BY PCR: GROUP A STREP BY PCR: NOT DETECTED

## 2018-07-02 MED ORDER — PSEUDOEPHEDRINE HCL 60 MG PO TABS: 60.0000 mg | ORAL_TABLET | Freq: Four times a day (QID) | ORAL | 0 refills | Status: DC | PRN

## 2018-07-02 MED ORDER — MAGIC MOUTHWASH W/LIDOCAINE: 5.0000 mL | Freq: Three times a day (TID) | ORAL | 0 refills | Status: DC | PRN

## 2018-07-02 NOTE — Discharge Instructions (Addendum)
Drink plenty of fluids while taking the prescribed medications.  Follow-up with your primary doctor for recheck, return to the ER for any worsening symptoms.

## 2018-07-02 NOTE — ED Triage Notes (Signed)
Patient complains of sinus drainage and congestion x 3 days. States sore throat is main concern today. Denies fever.

## 2018-07-03 NOTE — ED Provider Notes (Signed)
[facility] EMERGENCY DEPARTMENT Provider Note   CSN: [id number] Arrival date & time: 07/02/18  1046     History   Chief Complaint Chief Complaint  Patient presents with  . Sore Throat    HPI Kathryn Skinner is a 48 y.o. female.  HPI   Kathryn Skinner is a 48 y.o. female who presents to the Emergency Department complaining of sore throat and laryngitis, nasal congestion and sinus drainage.  Symptoms began 3-4 days ago.  She reports having difficulty drinking fluids or eating solid foods due to the level of pain in her throat.  She has tried OTC cold and cough medications without relief.  She denies fever, chills, neck pain, cough and chest pain.     Past Medical History:  Diagnosis Date  . Allergy   . Eye disease   . Fibromyalgia   . Retinitis pigmentosa   . 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 Thyroid disease    hypothyroid    Patient Active Problem List   Diagnosis Date Noted  . Retinitis pigmentosa 12/10/2016  . Hypothyroidism 12/10/2016  . Visual impairment 12/10/2016  . Obesity (BMI 30-39.9) 08/13/2016  . Breast cancer ([place])   . Enterocolitis 11/25/2015  . S/P mastectomy, bilateral 09/13/2015    Past Surgical History:  Procedure Laterality Date  . BREAST SURGERY    . CHOLECYSTECTOMY    . COLONOSCOPY N/A 11/27/2015   Procedure: COLONOSCOPY;  Surgeon: Gatha Mayer, MD;  Location: [place];  Service: Endoscopy;  Laterality: N/A;  . MASTECTOMY Bilateral 2017  . PORT-A-CATH REMOVAL    . PORTACATH PLACEMENT       OB History    Gravida  2   Para  1   Term      Preterm      AB  1   Living  1     SAB  1   TAB      Ectopic      Multiple      Live Births  1            Home Medications    Prior to Admission medications   Medication Sig Start Date End Date Taking? Authorizing Provider  azithromycin (ZITHROMAX) 250 MG tablet Take two pills for the first day and one pill a day for the remaining four days.  07/15/17   Caren Macadam, MD  clindamycin (CLEOCIN) 150 MG capsule 3 tabs PO TID x 10 days 11/07/17   Francine Graven, DO  diphenhydrAMINE (BENADRYL ALLERGY) 25 mg capsule Take 25 mg by mouth every 6 (six) hours as needed.    [provider]  ibuprofen (ADVIL,MOTRIN) 800 MG tablet TK 1 T PO Q 8 H PRF PAIN 04/22/17   [provider]  levothyroxine (SYNTHROID, LEVOTHROID) 50 MCG tablet Take 1 tablet (50 mcg total) by mouth daily before breakfast. 06/24/17   Raylene Everts, MD  magic mouthwash w/lidocaine SOLN Take 5 mLs by mouth 3 (three) times daily as needed for mouth pain. Swish and spit, do not swallow 07/02/18   Tana Trefry, PA-C  penicillin v potassium (VEETID) 500 MG tablet Take one tab 3 times a day Patient not taking: Reported on 11/07/2017 07/02/17   Raylene Everts, MD  predniSONE (DELTASONE) 20 MG tablet Take 2 tablets (40 mg total) by mouth daily. 11/07/17   Francine Graven, DO  pseudoephedrine (SUDAFED) 60 MG tablet Take 1 tablet (60 mg total) by mouth every 6 (six)  hours as needed for congestion. 07/02/18   Denyla Cortese, PA-C  triamcinolone cream (KENALOG) 0.1 % Apply 1 application topically 2 (two) times daily. 03/27/18   Hayden Rasmussen, MD    Family History Family History  Problem Relation Age of Onset  . Thyroid disease Mother   . Thyroid disease Sister   . Asthma Sister   . Thyroid disease Maternal Grandmother   . Emphysema Maternal Grandmother   . Cancer Maternal Grandmother        lung  . Alcohol abuse Maternal Grandfather   . Cancer Maternal Grandfather        pancreatic  . Liver disease Maternal Aunt     Social History Social History   Tobacco Use  . Smoking status: Never Smoker  . Smokeless tobacco: Never Used  . Tobacco comment: N/I  Substance Use Topics  . Alcohol use: No  . Drug use: No     Allergies   Sulfamethoxazole-trimethoprim   Review of Systems Review of Systems  Constitutional: Negative for activity change,  appetite change, chills and fever.  HENT: Positive for congestion, sinus pressure, sinus pain, sore throat, trouble swallowing and voice change. Negative for ear pain and facial swelling.   Eyes: Negative for pain and visual disturbance.  Respiratory: Negative for cough and shortness of breath.   Cardiovascular: Negative for chest pain.  Gastrointestinal: Negative for abdominal pain, nausea and vomiting.  Musculoskeletal: Negative for arthralgias, myalgias, neck pain and neck stiffness.  Skin: Negative for color change and rash.  Neurological: Negative for dizziness, facial asymmetry, speech difficulty, weakness, numbness and headaches.  Hematological: Negative for adenopathy.     Physical Exam Updated Vital Signs BP 140/89 (BP Location: Left Arm)   Pulse 92   Temp 98.2 F (36.8 C) (Oral)   Resp 18   Ht 5\' 6"  (1.676 m)   Wt 88 kg   SpO2 100%   BMI 31.31 kg/m   Physical Exam Vitals signs and nursing note reviewed.  Constitutional:      Appearance: She is well-developed. She is not ill-appearing or toxic-appearing.  HENT:     Head: Atraumatic.     Right Ear: Tympanic membrane normal.     Left Ear: Tympanic membrane and ear canal normal.     Nose: Congestion present. No rhinorrhea.     Mouth/Throat:     Mouth: Mucous membranes are moist. No oral lesions.     Pharynx: Oropharynx is clear. Uvula midline. Posterior oropharyngeal erythema present. No pharyngeal swelling, oropharyngeal exudate or uvula swelling.     Tonsils: No tonsillar exudate or tonsillar abscesses.  Neck:     Musculoskeletal: Normal range of motion and neck supple.  Cardiovascular:     Rate and Rhythm: Normal rate and regular rhythm.  Pulmonary:     Effort: Pulmonary effort is normal. No respiratory distress.     Breath sounds: Normal breath sounds.  Lymphadenopathy:     Cervical: No cervical adenopathy.  Skin:    General: Skin is warm.  Neurological:     General: No focal deficit present.     Mental  Status: She is alert.      ED Treatments / Results  Labs (all labs ordered are listed, but only abnormal results are displayed) Labs Reviewed  GROUP A STREP BY PCR    EKG None  Radiology No results found.  Procedures Procedures (including critical care time)  Medications Ordered in ED Medications - No data to display   Initial Impression /  Assessment and Plan / ED Course  I have reviewed the triage vital signs and the nursing notes.  Pertinent labs & imaging results that were available during my care of the patient were reviewed by me and considered in my medical decision making (see chart for details).    Strep PCR negative, symptoms likely viral.  Airway patent, no concerning symptoms for peritonsillar or retropharyngeal abscess.  Patient agrees to treatment plan and PCP follow-up if not improving.  Final Clinical Impressions(s) / ED Diagnoses   Final diagnoses:  Acute pharyngitis, unspecified etiology  Upper respiratory tract infection, unspecified type    ED Discharge Orders         Ordered    magic mouthwash w/lidocaine SOLN  3 times daily PRN     07/02/18 1203    pseudoephedrine (SUDAFED) 60 MG tablet  Every 6 hours PRN     07/02/18 1203           Kem Parkinson, PA-C 07/03/18 8110    Milton Ferguson, MD 07/03/18 1503

## 2018-07-08 DIAGNOSIS — Z7689 Persons encountering health services in other specified circumstances: Secondary | ICD-10-CM | POA: Diagnosis not present

## 2018-07-08 DIAGNOSIS — H3552 Pigmentary retinal dystrophy: Secondary | ICD-10-CM | POA: Diagnosis not present

## 2018-07-29 DIAGNOSIS — J209 Acute bronchitis, unspecified: Secondary | ICD-10-CM | POA: Diagnosis not present

## 2018-07-29 DIAGNOSIS — J329 Chronic sinusitis, unspecified: Secondary | ICD-10-CM | POA: Diagnosis not present

## 2018-07-29 DIAGNOSIS — J111 Influenza due to unidentified influenza virus with other respiratory manifestations: Secondary | ICD-10-CM | POA: Diagnosis not present

## 2018-08-01 IMAGING — DX DG HUMERUS 2V *R*
2 series · 2 of 2 positions shown · non-contrast
Comparison: None.

CLINICAL DATA: Bee sting yesterday to right mid upper arm with
worsening swelling and redness today.

EXAM:
RIGHT HUMERUS - 2+ VIEW

[humerus ap]
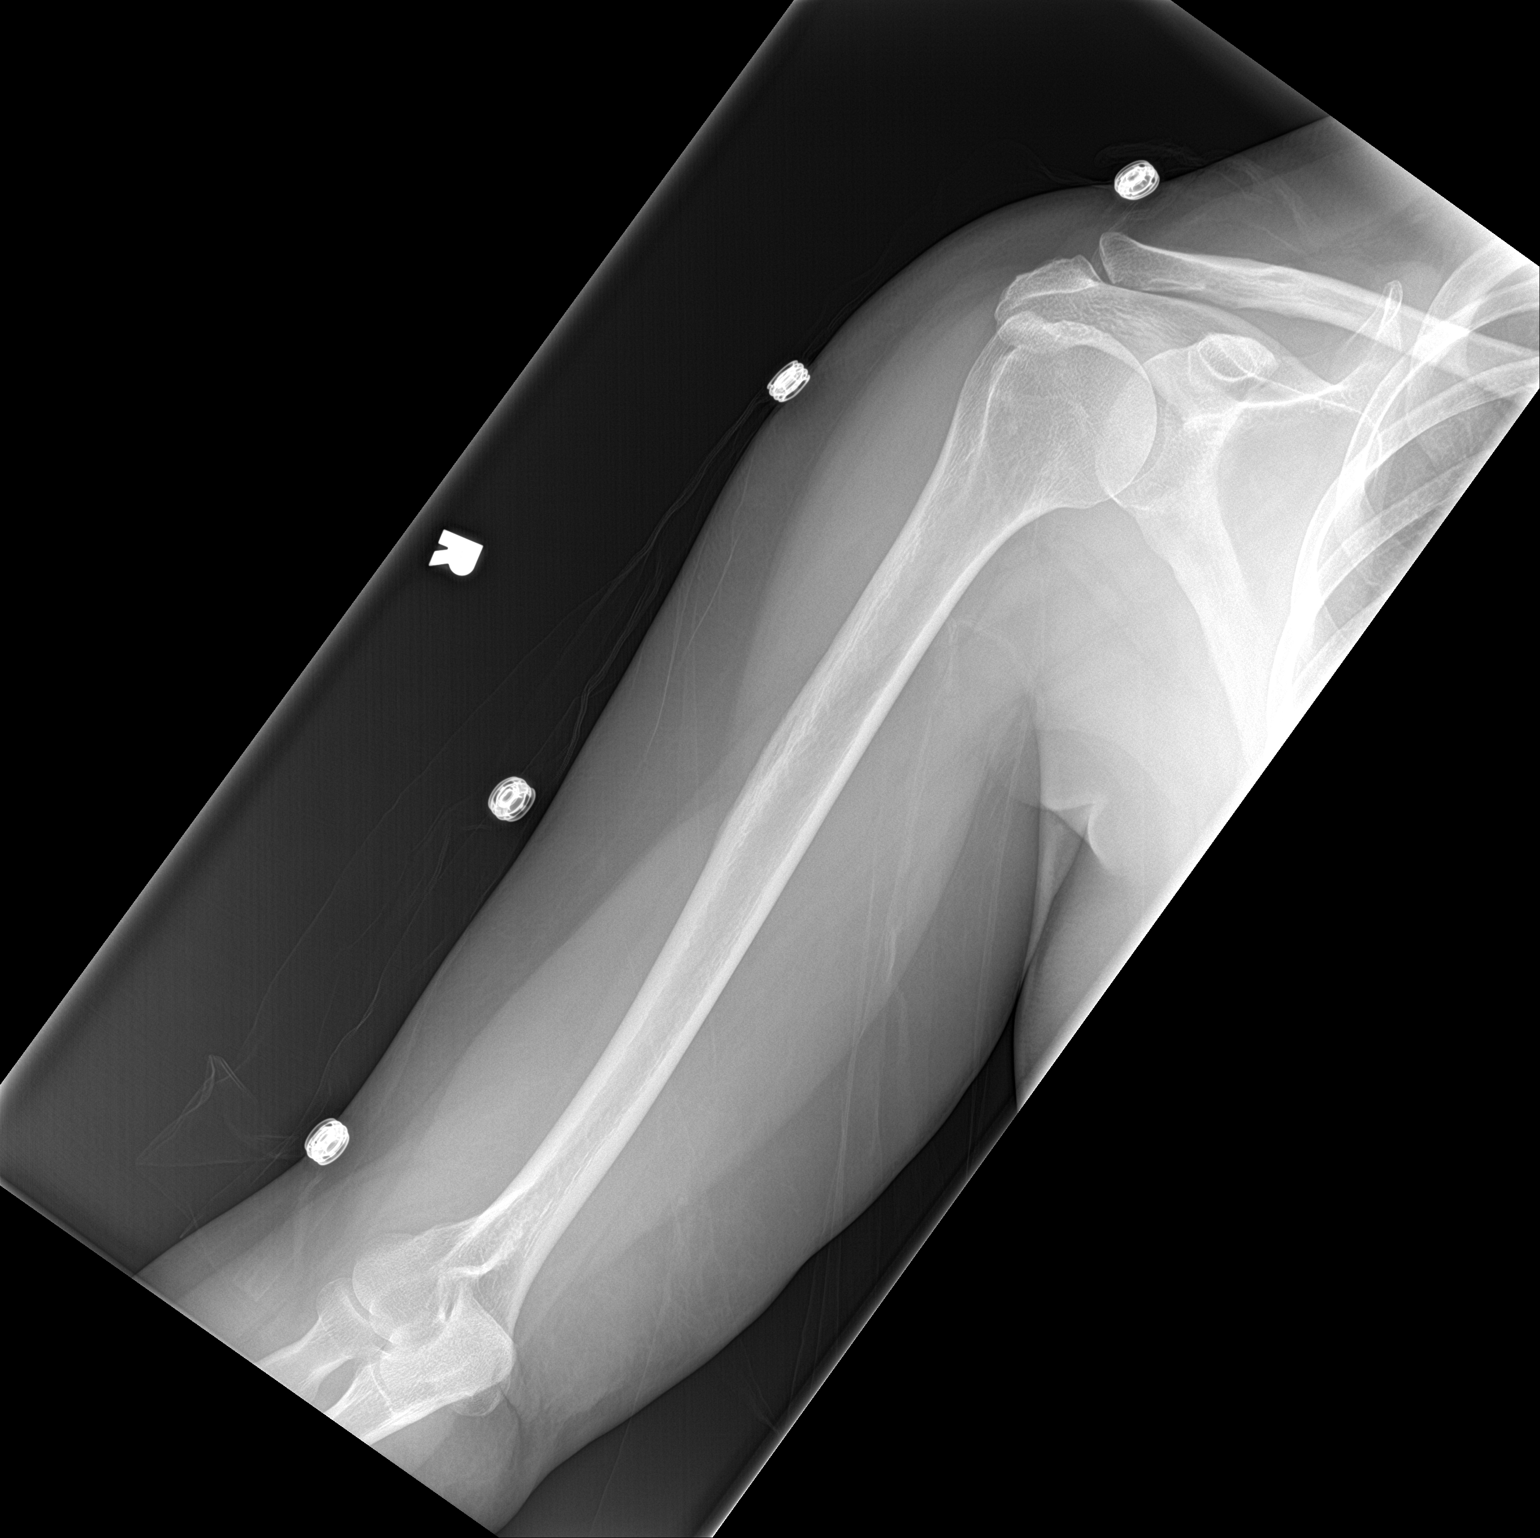

[humerus lat]
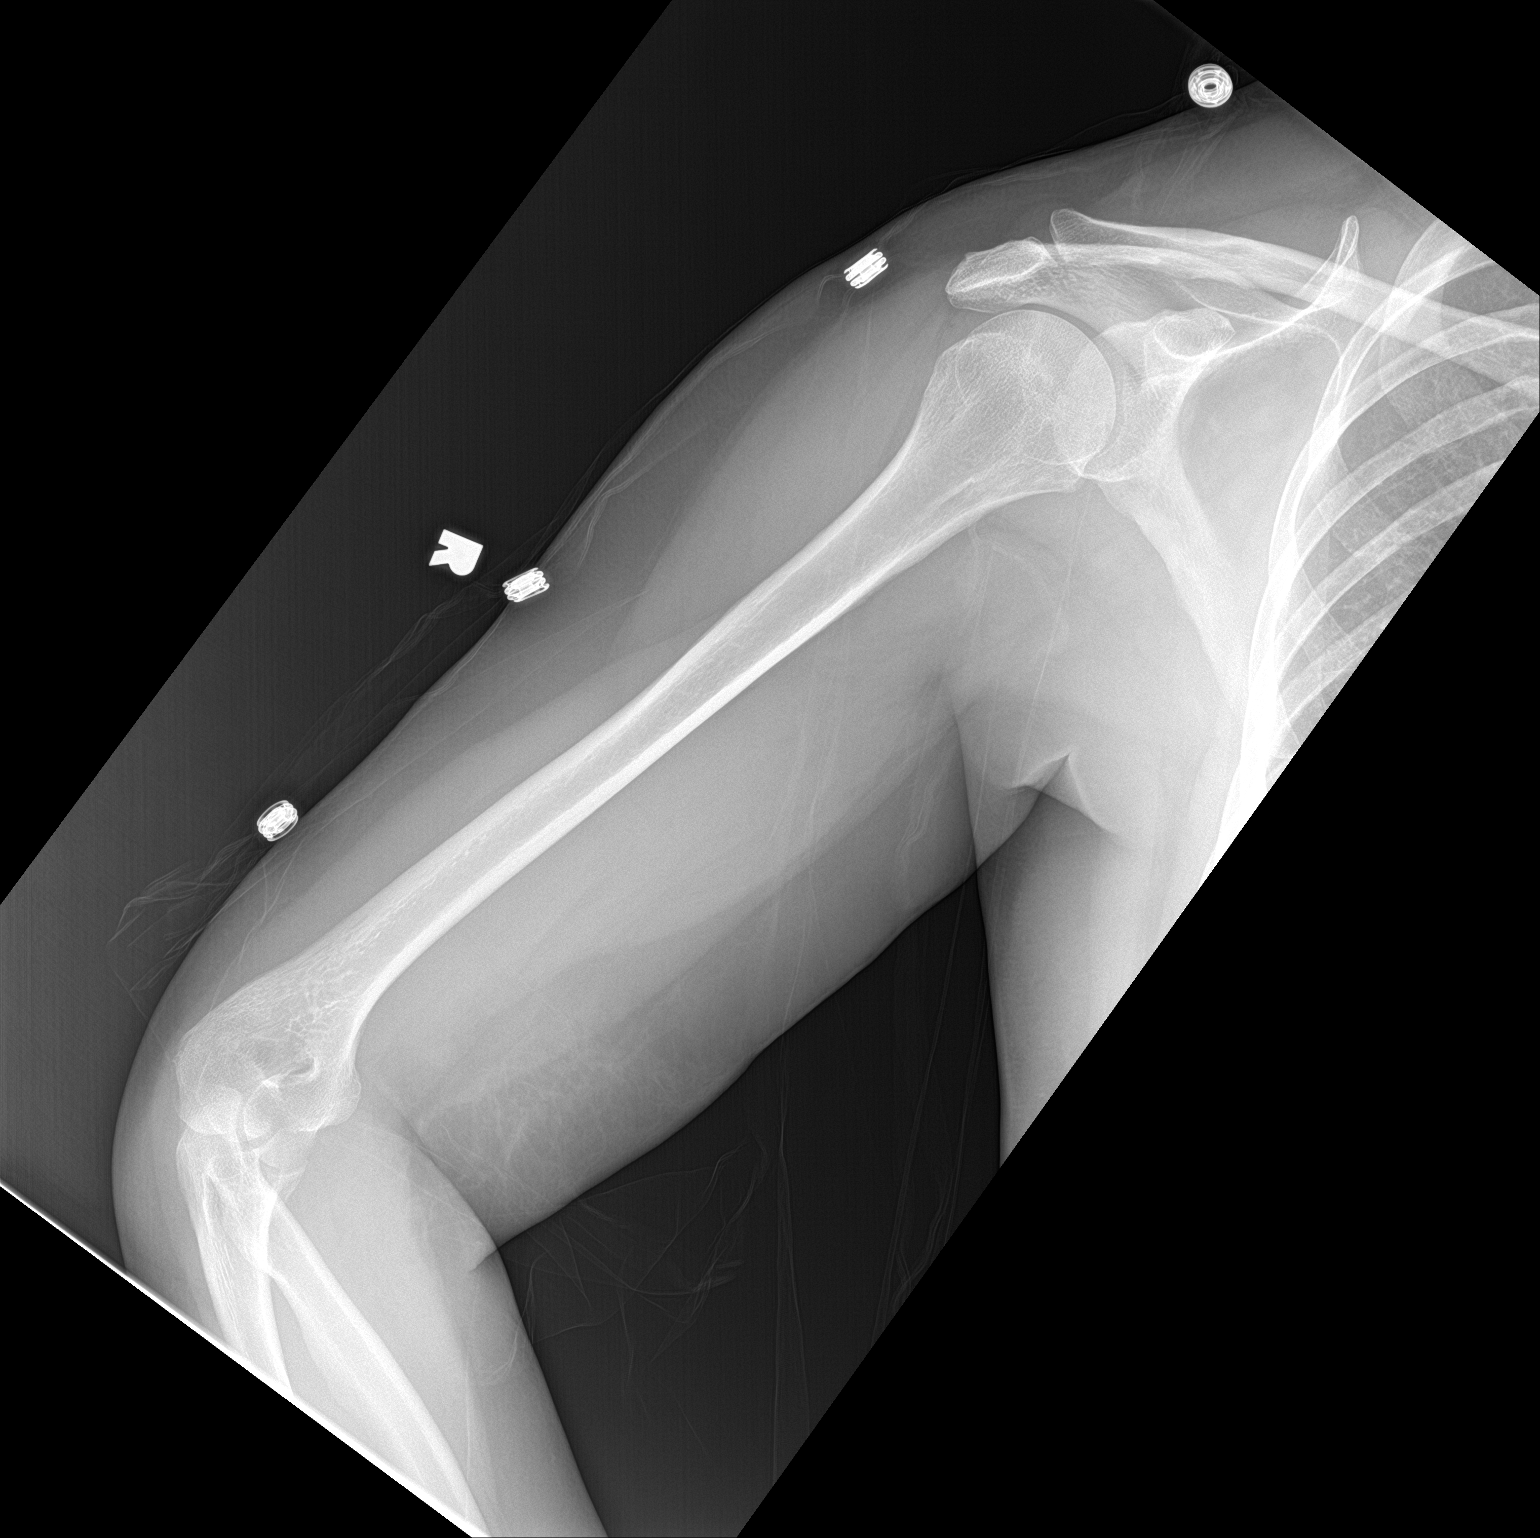

[2 of 2 positions shown; findings below may reference images not displayed]

FINDINGS: Bony structures joint spaces and soft tissues are within normal.
IMPRESSION: Negative.

## 2018-08-10 DIAGNOSIS — R05 Cough: Secondary | ICD-10-CM | POA: Diagnosis not present

## 2018-08-10 DIAGNOSIS — E039 Hypothyroidism, unspecified: Secondary | ICD-10-CM | POA: Diagnosis not present

## 2018-08-10 DIAGNOSIS — Z853 Personal history of malignant neoplasm of breast: Secondary | ICD-10-CM | POA: Diagnosis not present

## 2018-08-10 DIAGNOSIS — Z9013 Acquired absence of bilateral breasts and nipples: Secondary | ICD-10-CM | POA: Diagnosis not present

## 2018-09-05 DIAGNOSIS — E039 Hypothyroidism, unspecified: Secondary | ICD-10-CM | POA: Diagnosis not present

## 2018-09-05 DIAGNOSIS — Z0000 Encounter for general adult medical examination without abnormal findings: Secondary | ICD-10-CM | POA: Diagnosis not present

## 2018-09-14 DIAGNOSIS — E785 Hyperlipidemia, unspecified: Secondary | ICD-10-CM | POA: Diagnosis not present

## 2018-09-14 DIAGNOSIS — E6609 Other obesity due to excess calories: Secondary | ICD-10-CM | POA: Diagnosis not present

## 2018-09-14 DIAGNOSIS — Z0001 Encounter for general adult medical examination with abnormal findings: Secondary | ICD-10-CM | POA: Diagnosis not present

## 2018-09-14 DIAGNOSIS — Z7689 Persons encountering health services in other specified circumstances: Secondary | ICD-10-CM | POA: Diagnosis not present

## 2018-09-14 DIAGNOSIS — Z9013 Acquired absence of bilateral breasts and nipples: Secondary | ICD-10-CM | POA: Diagnosis not present

## 2018-09-14 DIAGNOSIS — E039 Hypothyroidism, unspecified: Secondary | ICD-10-CM | POA: Diagnosis not present

## 2018-09-14 DIAGNOSIS — N390 Urinary tract infection, site not specified: Secondary | ICD-10-CM | POA: Diagnosis not present

## 2018-11-07 DIAGNOSIS — Z6830 Body mass index (BMI) 30.0-30.9, adult: Secondary | ICD-10-CM | POA: Diagnosis not present

## 2018-11-07 DIAGNOSIS — Z9189 Other specified personal risk factors, not elsewhere classified: Secondary | ICD-10-CM | POA: Diagnosis not present

## 2018-11-07 DIAGNOSIS — Z6832 Body mass index (BMI) 32.0-32.9, adult: Secondary | ICD-10-CM | POA: Diagnosis not present

## 2018-11-07 DIAGNOSIS — M898X9 Other specified disorders of bone, unspecified site: Secondary | ICD-10-CM | POA: Diagnosis not present

## 2018-11-07 DIAGNOSIS — E669 Obesity, unspecified: Secondary | ICD-10-CM | POA: Diagnosis not present

## 2018-11-07 DIAGNOSIS — Z9013 Acquired absence of bilateral breasts and nipples: Secondary | ICD-10-CM | POA: Diagnosis not present

## 2018-11-07 DIAGNOSIS — C50211 Malignant neoplasm of upper-inner quadrant of right female breast: Secondary | ICD-10-CM | POA: Diagnosis not present

## 2018-11-07 DIAGNOSIS — E6609 Other obesity due to excess calories: Secondary | ICD-10-CM | POA: Diagnosis not present

## 2018-11-07 DIAGNOSIS — Z171 Estrogen receptor negative status [ER-]: Secondary | ICD-10-CM | POA: Diagnosis not present

## 2018-11-07 DIAGNOSIS — Z08 Encounter for follow-up examination after completed treatment for malignant neoplasm: Secondary | ICD-10-CM | POA: Diagnosis not present

## 2018-11-07 DIAGNOSIS — Z853 Personal history of malignant neoplasm of breast: Secondary | ICD-10-CM | POA: Diagnosis not present

## 2018-12-16 DIAGNOSIS — E039 Hypothyroidism, unspecified: Secondary | ICD-10-CM | POA: Diagnosis not present

## 2018-12-16 DIAGNOSIS — E785 Hyperlipidemia, unspecified: Secondary | ICD-10-CM | POA: Diagnosis not present

## 2018-12-21 DIAGNOSIS — G43109 Migraine with aura, not intractable, without status migrainosus: Secondary | ICD-10-CM | POA: Diagnosis not present

## 2018-12-21 DIAGNOSIS — E039 Hypothyroidism, unspecified: Secondary | ICD-10-CM | POA: Diagnosis not present

## 2018-12-21 DIAGNOSIS — E785 Hyperlipidemia, unspecified: Secondary | ICD-10-CM | POA: Diagnosis not present

## 2019-01-06 DIAGNOSIS — H3552 Pigmentary retinal dystrophy: Secondary | ICD-10-CM | POA: Diagnosis not present

## 2019-02-13 DIAGNOSIS — H9192 Unspecified hearing loss, left ear: Secondary | ICD-10-CM | POA: Diagnosis not present

## 2019-03-01 DIAGNOSIS — G43109 Migraine with aura, not intractable, without status migrainosus: Secondary | ICD-10-CM | POA: Diagnosis not present

## 2019-03-01 DIAGNOSIS — N390 Urinary tract infection, site not specified: Secondary | ICD-10-CM | POA: Diagnosis not present

## 2019-03-23 ENCOUNTER — Ambulatory Visit (INDEPENDENT_AMBULATORY_CARE_PROVIDER_SITE_OTHER): Payer: Medicaid Other | Admitting: Otolaryngology

## 2019-03-23 DIAGNOSIS — H903 Sensorineural hearing loss, bilateral: Secondary | ICD-10-CM

## 2019-03-29 ENCOUNTER — Telehealth: Payer: Self-pay | Admitting: Adult Health

## 2019-03-29 DIAGNOSIS — E785 Hyperlipidemia, unspecified: Secondary | ICD-10-CM | POA: Diagnosis not present

## 2019-03-29 DIAGNOSIS — E039 Hypothyroidism, unspecified: Secondary | ICD-10-CM | POA: Diagnosis not present

## 2019-03-29 DIAGNOSIS — G43109 Migraine with aura, not intractable, without status migrainosus: Secondary | ICD-10-CM | POA: Diagnosis not present

## 2019-03-29 NOTE — Telephone Encounter (Signed)

## 2019-03-30 ENCOUNTER — Ambulatory Visit: Payer: Medicaid Other | Admitting: Adult Health

## 2019-03-30 ENCOUNTER — Other Ambulatory Visit (HOSPITAL_COMMUNITY)
Admission: RE | Admit: 2019-03-30 | Discharge: 2019-03-30 | Disposition: A | Discharge status: Home / Self Care | Payer: Medicaid Other | Source: Ambulatory Visit | Attending: Adult Health | Admitting: Adult Health

## 2019-03-30 ENCOUNTER — Other Ambulatory Visit: Payer: Self-pay

## 2019-03-30 ENCOUNTER — Encounter: Payer: Self-pay | Admitting: Adult Health

## 2019-03-30 VITALS — BP 105/71 | Ht 66.0 in | Wt 197.0 lb

## 2019-03-30 DIAGNOSIS — Z01419 Encounter for gynecological examination (general) (routine) without abnormal findings: Secondary | ICD-10-CM | POA: Insufficient documentation

## 2019-03-30 DIAGNOSIS — Z124 Encounter for screening for malignant neoplasm of cervix: Secondary | ICD-10-CM | POA: Diagnosis not present

## 2019-03-30 DIAGNOSIS — Z0000 Encounter for general adult medical examination without abnormal findings: Secondary | ICD-10-CM | POA: Diagnosis not present

## 2019-03-30 DIAGNOSIS — Z1211 Encounter for screening for malignant neoplasm of colon: Secondary | ICD-10-CM | POA: Diagnosis not present

## 2019-03-30 DIAGNOSIS — E785 Hyperlipidemia, unspecified: Secondary | ICD-10-CM | POA: Diagnosis not present

## 2019-03-30 DIAGNOSIS — Z853 Personal history of malignant neoplasm of breast: Secondary | ICD-10-CM | POA: Insufficient documentation

## 2019-03-30 DIAGNOSIS — E039 Hypothyroidism, unspecified: Secondary | ICD-10-CM | POA: Diagnosis not present

## 2019-03-30 DIAGNOSIS — Z1212 Encounter for screening for malignant neoplasm of rectum: Secondary | ICD-10-CM | POA: Diagnosis not present

## 2019-03-30 LAB — HEMOCCULT GUIAC POC 1CARD (OFFICE): Fecal Occult Blood, POC: NEGATIVE

## 2019-03-30 NOTE — Progress Notes (Signed)
Patient ID: Kathryn Skinner, female   DOB: Nov 23, 1970, 48 y.o.   MRN: [id number] History of Present Illness: Kathryn Skinner is a 48 year old white female,divorcd, PM, in for a pelvic and pap. She has boyfriend of 7 years and she is disabled. She had breast cancer in 2016 and had a bilateral mastectomy. PCP is [facility].     Current Medications, Allergies, Past Medical History, Past Surgical History, Family History and Social History were reviewed in [employer] record.     Review of Systems: Patient denies any headaches, hearing loss, fatigue, blurred vision, shortness of breath, chest pain, abdominal pain, problems with bowel movements,  or intercourse. No joint pain or mood swings. She has recently had UTI and has some burning when she pees and sometimes has to pee during the night, and panties may feel wet in am.   Physical Exam:BP 105/71 (BP Location: Left Arm, Patient Position: Sitting, Cuff Size: Normal)   Ht 5\' 6"  (1.676 m)   Wt 197 lb (89.4 kg)   BMI 31.80 kg/m  General:  Well developed, well nourished, no acute distress Skin:  Warm and dry Neck:  Midline trachea, normal thyroid, good ROM, no lymphadenopathy Lungs; Clear to auscultation bilaterally Cardiovascular: Regular rate and rhythm Pelvic:  External genitalia is normal in appearance, no lesions.  The vagina is normal in appearance.Has mild cystocele. Urethra has no lesions or masses. The cervix is bulbous, stenotic at os, pap with HPV and 16/18 genotyping performed.  Uterus is felt to be normal size, shape, and contour.  No adnexal masses or tenderness noted.Bladder is non tender, no masses felt. Rectal: Good sphincter tone, no polyps, or hemorrhoids felt.  Hemoccult negative. Psych:  No mood changes, alert and cooperative,seems happy Fall risk is low PHQ 2 score is 0. Examination chaperoned by Levy Pupa LPN  Impression and Plan: 1. Routine cervical smear -pap with high risk HPV and 16/18  genotyping sent Pap in 3 years if normal  -has mild cystocele, pee before bed and before and after sex  2. Screening for colorectal cancer   3. Encounter for gynecological examination with Papanicolaou smear of cervix -colonoscopy per GI -labs with PCP -physical with PCP Follow up with PCP if burning continues with urination   4. History of breast cancer -no longer gets mammograms, had bilateral mastectomy

## 2019-04-10 LAB — CYTOLOGY - PAP
Chlamydia: NEGATIVE
Diagnosis: NEGATIVE
Diagnosis: REACTIVE
High risk HPV: NEGATIVE
Neisseria Gonorrhea: NEGATIVE

## 2019-04-19 ENCOUNTER — Telehealth: Payer: Self-pay | Admitting: *Deleted

## 2019-04-19 NOTE — Telephone Encounter (Signed)
Patient calling for pap results

## 2019-04-19 NOTE — Telephone Encounter (Signed)
Patient informed pap smear negative. Verbalized understanding.

## 2019-04-26 DIAGNOSIS — Z23 Encounter for immunization: Secondary | ICD-10-CM | POA: Diagnosis not present

## 2019-04-26 DIAGNOSIS — E785 Hyperlipidemia, unspecified: Secondary | ICD-10-CM | POA: Diagnosis not present

## 2019-04-26 DIAGNOSIS — N390 Urinary tract infection, site not specified: Secondary | ICD-10-CM | POA: Diagnosis not present

## 2019-04-26 DIAGNOSIS — E039 Hypothyroidism, unspecified: Secondary | ICD-10-CM | POA: Diagnosis not present

## 2019-04-26 DIAGNOSIS — G43109 Migraine with aura, not intractable, without status migrainosus: Secondary | ICD-10-CM | POA: Diagnosis not present

## 2019-08-08 DIAGNOSIS — E039 Hypothyroidism, unspecified: Secondary | ICD-10-CM | POA: Diagnosis not present

## 2019-08-08 DIAGNOSIS — E785 Hyperlipidemia, unspecified: Secondary | ICD-10-CM | POA: Diagnosis not present

## 2019-08-24 DIAGNOSIS — Z79899 Other long term (current) drug therapy: Secondary | ICD-10-CM | POA: Diagnosis not present

## 2019-08-24 DIAGNOSIS — G43109 Migraine with aura, not intractable, without status migrainosus: Secondary | ICD-10-CM | POA: Diagnosis not present

## 2019-08-24 DIAGNOSIS — E039 Hypothyroidism, unspecified: Secondary | ICD-10-CM | POA: Diagnosis not present

## 2019-08-24 DIAGNOSIS — E785 Hyperlipidemia, unspecified: Secondary | ICD-10-CM | POA: Diagnosis not present

## 2019-09-11 DIAGNOSIS — Z6832 Body mass index (BMI) 32.0-32.9, adult: Secondary | ICD-10-CM | POA: Diagnosis not present

## 2019-09-11 DIAGNOSIS — C50211 Malignant neoplasm of upper-inner quadrant of right female breast: Secondary | ICD-10-CM | POA: Diagnosis not present

## 2019-09-11 DIAGNOSIS — Z171 Estrogen receptor negative status [ER-]: Secondary | ICD-10-CM | POA: Diagnosis not present

## 2019-09-11 DIAGNOSIS — E6609 Other obesity due to excess calories: Secondary | ICD-10-CM | POA: Diagnosis not present

## 2019-09-11 DIAGNOSIS — Z9189 Other specified personal risk factors, not elsewhere classified: Secondary | ICD-10-CM | POA: Diagnosis not present

## 2019-10-12 DIAGNOSIS — J019 Acute sinusitis, unspecified: Secondary | ICD-10-CM | POA: Diagnosis not present

## 2019-10-12 DIAGNOSIS — J069 Acute upper respiratory infection, unspecified: Secondary | ICD-10-CM | POA: Diagnosis not present

## 2019-10-12 DIAGNOSIS — H6093 Unspecified otitis externa, bilateral: Secondary | ICD-10-CM | POA: Diagnosis not present

## 2019-10-12 DIAGNOSIS — J029 Acute pharyngitis, unspecified: Secondary | ICD-10-CM | POA: Diagnosis not present

## 2019-10-18 DIAGNOSIS — Z23 Encounter for immunization: Secondary | ICD-10-CM | POA: Diagnosis not present

## 2019-10-31 DIAGNOSIS — Z853 Personal history of malignant neoplasm of breast: Secondary | ICD-10-CM | POA: Diagnosis not present

## 2019-10-31 DIAGNOSIS — Z7689 Persons encountering health services in other specified circumstances: Secondary | ICD-10-CM | POA: Diagnosis not present

## 2019-11-02 DIAGNOSIS — J029 Acute pharyngitis, unspecified: Secondary | ICD-10-CM | POA: Diagnosis not present

## 2019-11-02 DIAGNOSIS — J329 Chronic sinusitis, unspecified: Secondary | ICD-10-CM | POA: Diagnosis not present

## 2019-11-15 DIAGNOSIS — Z23 Encounter for immunization: Secondary | ICD-10-CM | POA: Diagnosis not present

## 2019-12-26 DIAGNOSIS — G43109 Migraine with aura, not intractable, without status migrainosus: Secondary | ICD-10-CM | POA: Diagnosis not present

## 2019-12-26 DIAGNOSIS — Z79899 Other long term (current) drug therapy: Secondary | ICD-10-CM | POA: Diagnosis not present

## 2019-12-26 DIAGNOSIS — E039 Hypothyroidism, unspecified: Secondary | ICD-10-CM | POA: Diagnosis not present

## 2019-12-26 DIAGNOSIS — E7849 Other hyperlipidemia: Secondary | ICD-10-CM | POA: Diagnosis not present

## 2020-03-08 ENCOUNTER — Other Ambulatory Visit: Payer: Medicaid Other

## 2020-03-08 ENCOUNTER — Other Ambulatory Visit: Payer: Self-pay

## 2020-03-08 DIAGNOSIS — Z20822 Contact with and (suspected) exposure to covid-19: Secondary | ICD-10-CM

## 2020-03-15 LAB — NOVEL CORONAVIRUS, NAA: SARS-CoV-2, NAA: NOT DETECTED

## 2020-03-21 DIAGNOSIS — E039 Hypothyroidism, unspecified: Secondary | ICD-10-CM | POA: Diagnosis not present

## 2020-03-21 DIAGNOSIS — E7849 Other hyperlipidemia: Secondary | ICD-10-CM | POA: Diagnosis not present

## 2020-03-21 DIAGNOSIS — Z79899 Other long term (current) drug therapy: Secondary | ICD-10-CM | POA: Diagnosis not present

## 2020-03-28 DIAGNOSIS — E785 Hyperlipidemia, unspecified: Secondary | ICD-10-CM | POA: Diagnosis not present

## 2020-03-28 DIAGNOSIS — Z23 Encounter for immunization: Secondary | ICD-10-CM | POA: Diagnosis not present

## 2020-03-28 DIAGNOSIS — E559 Vitamin D deficiency, unspecified: Secondary | ICD-10-CM | POA: Diagnosis not present

## 2020-03-28 DIAGNOSIS — R300 Dysuria: Secondary | ICD-10-CM | POA: Diagnosis not present

## 2020-03-28 DIAGNOSIS — E039 Hypothyroidism, unspecified: Secondary | ICD-10-CM | POA: Diagnosis not present

## 2020-03-28 DIAGNOSIS — Z79899 Other long term (current) drug therapy: Secondary | ICD-10-CM | POA: Diagnosis not present

## 2020-04-04 DIAGNOSIS — E6609 Other obesity due to excess calories: Secondary | ICD-10-CM | POA: Diagnosis not present

## 2020-04-04 DIAGNOSIS — Z171 Estrogen receptor negative status [ER-]: Secondary | ICD-10-CM | POA: Diagnosis not present

## 2020-04-04 DIAGNOSIS — Z6832 Body mass index (BMI) 32.0-32.9, adult: Secondary | ICD-10-CM | POA: Diagnosis not present

## 2020-04-04 DIAGNOSIS — Z9013 Acquired absence of bilateral breasts and nipples: Secondary | ICD-10-CM | POA: Diagnosis not present

## 2020-04-04 DIAGNOSIS — Z9189 Other specified personal risk factors, not elsewhere classified: Secondary | ICD-10-CM | POA: Diagnosis not present

## 2020-04-04 DIAGNOSIS — C50211 Malignant neoplasm of upper-inner quadrant of right female breast: Secondary | ICD-10-CM | POA: Diagnosis not present

## 2020-04-25 ENCOUNTER — Institutional Professional Consult (permissible substitution): Payer: Medicaid Other | Admitting: Plastic Surgery

## 2020-04-29 ENCOUNTER — Encounter: Payer: Self-pay | Admitting: Plastic Surgery

## 2020-04-29 ENCOUNTER — Ambulatory Visit (INDEPENDENT_AMBULATORY_CARE_PROVIDER_SITE_OTHER): Payer: Medicaid Other | Admitting: Plastic Surgery

## 2020-04-29 ENCOUNTER — Other Ambulatory Visit: Payer: Self-pay

## 2020-04-29 VITALS — BP 136/84 | HR 84 | Temp 98.2°F | Ht 66.0 in | Wt 194.0 lb

## 2020-04-29 DIAGNOSIS — Z853 Personal history of malignant neoplasm of breast: Secondary | ICD-10-CM

## 2020-04-29 NOTE — Progress Notes (Signed)
[facility], [facility] [place],  [place] [address]   CC:  Chief Complaint  Patient presents with  . Advice Only      Kathryn Skinner is an 49 y.o. female.  HPI: Patient presents to discuss breast reconstruction.  She had breast cancer diagnosed about 5 years ago.  She underwent a bilateral mastectomy followed by chemotherapy and radiation to the right side.  She did not have reconstruction at the time because her plastic surgeon had a medical issue and she could not have immediate reconstruction.  She is interested in being around a C cup.  She does not smoke and is not diabetic.  Only other surgery is a cholecystectomy.  Allergies  Allergen Reactions  . Sulfamethoxazole-Trimethoprim Rash    Outpatient Encounter Medications as of 04/29/2020  Medication Sig  . atorvastatin (LIPITOR) 10 MG tablet TK 1 T PO HS FOR HIGH CHOLESTEROL  . levothyroxine (SYNTHROID, LEVOTHROID) 50 MCG tablet Take 1 tablet (50 mcg total) by mouth daily before breakfast. (Patient taking differently: Take 75 mcg by mouth daily before breakfast. )   No facility-administered encounter medications on file as of 04/29/2020.     Past Medical History:  Diagnosis Date  . Allergy   . Breast cancer ([place]) 2016   Right Breast  . Breast disorder    cancer  . Eye disease   . Fibromyalgia   . Retinitis pigmentosa   . 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 Thyroid disease    hypothyroid    Past Surgical History:  Procedure Laterality Date  . BREAST SURGERY    . CHOLECYSTECTOMY    . COLONOSCOPY N/A 11/27/2015   Procedure: COLONOSCOPY;  Surgeon: Gatha Mayer, MD;  Location: [place];  Service: Endoscopy;  Laterality: N/A;  . MASTECTOMY Bilateral 2017  . PORT-A-CATH REMOVAL    . PORTACATH PLACEMENT      Family History  Problem Relation Age of Onset  . Thyroid disease Mother   . Thyroid disease Sister   . Asthma Sister   . Thyroid disease Maternal  Grandmother   . Emphysema Maternal Grandmother   . Cancer Maternal Grandmother        lung  . Alcohol abuse Maternal Grandfather   . Cancer Maternal Grandfather        pancreatic  . Liver disease Maternal Aunt     Social History   Social History Narrative   Lives at home BF   Vision impaired since age 31, has eye disease that disables from work   Does not Nurse, learning disability is 4, daughter, with new baby 2018     Review of Systems General: Denies fevers, chills, weight loss CV: Denies chest pain, shortness of breath, palpitations  Physical Exam Vitals with BMI 04/29/2020 03/30/2019 07/02/2018  Height 5\' 6"  5\' 6"  5\' 6"   Weight 194 lbs 197 lbs 194 lbs  BMI 31.33 67.34 19.37  Systolic 902 409 735  Diastolic 84 71 89  Pulse 84 - 92    General:  No acute distress,  Alert and oriented, Non-Toxic, Normal speech and affect Breast: Evidence of bilateral mastectomy defects with transverse scars.  Radiation changes on the right side are not too bad with fairly normal-appearing skin.  There is no scars on her back.  Assessment/Plan Patient is a good candidate for delayed breast reconstruction.  We discussed bilateral tissue expander placement followed by exchange to gel implants.  I did  discuss that because of the radiation she is a higher risk for complications on the right side.  We discussed the potential for doing a latissimus flap at the time of the tissue expander placement but she prefers to see how things go and use the latissimus as a backup if necessary.  I think this is reasonable as her radiation changes are fairly minimal on that side.  We discussed the risk of the procedure that include bleeding, infection, damage to surrounding structures and need for additional procedures.  We discussed the potential complications could result in the failure of the reconstruction.  I discussed the need for drains and that I would use acellular dermal matrix.  We showed her examples of the tissue  expander and the gel implant.  All her questions were answered and we will plan to move forward.  Cindra Presume 04/29/2020, 1:45 PM

## 2020-05-08 ENCOUNTER — Ambulatory Visit (INDEPENDENT_AMBULATORY_CARE_PROVIDER_SITE_OTHER): Payer: Medicaid Other | Admitting: Plastic Surgery

## 2020-05-08 ENCOUNTER — Other Ambulatory Visit: Payer: Self-pay

## 2020-05-08 ENCOUNTER — Encounter: Payer: Self-pay | Admitting: Plastic Surgery

## 2020-05-08 VITALS — BP 140/79 | HR 92 | Temp 98.3°F | Ht 66.0 in | Wt 194.2 lb

## 2020-05-08 DIAGNOSIS — Z853 Personal history of malignant neoplasm of breast: Secondary | ICD-10-CM

## 2020-05-08 DIAGNOSIS — Z9013 Acquired absence of bilateral breasts and nipples: Secondary | ICD-10-CM

## 2020-05-08 MED ORDER — IBUPROFEN 600 MG PO TABS: 600.0000 mg | ORAL_TABLET | Freq: Four times a day (QID) | ORAL | 0 refills | Status: DC | PRN

## 2020-05-08 MED ORDER — DOXYCYCLINE HYCLATE 100 MG PO TABS: 100.0000 mg | ORAL_TABLET | Freq: Two times a day (BID) | ORAL | 0 refills | Status: AC

## 2020-05-08 MED ORDER — ONDANSETRON HCL 4 MG PO TABS: 4.0000 mg | ORAL_TABLET | Freq: Three times a day (TID) | ORAL | 0 refills | Status: DC | PRN

## 2020-05-08 MED ORDER — ACETAMINOPHEN 500 MG PO TABS: 500.0000 mg | ORAL_TABLET | Freq: Four times a day (QID) | ORAL | 0 refills | Status: DC | PRN

## 2020-05-08 MED ORDER — HYDROCODONE-ACETAMINOPHEN 5-325 MG PO TABS: 1.0000 | ORAL_TABLET | Freq: Three times a day (TID) | ORAL | 0 refills | Status: AC | PRN

## 2020-05-08 NOTE — Progress Notes (Signed)
ICD-10-CM   1. History of breast cancer  Z85.3   2. S/P mastectomy, bilateral  Z90.13       Patient ID: Kathryn Skinner, female    DOB: 1970/10/11, 49 y.o.   MRN: [id number]   History of Present Illness: Kathryn Skinner is a 49 y.o.  female  with a history of bilateral mastectomies due to breast cancer.  She presents for preoperative evaluation for upcoming procedure, Bilateral reconstruction with placement of tissue expanders and Flex HD, scheduled for 05/21/2020 with Dr. Claudia Desanctis.  Summary from previous visit: Patient was diagnosed with breast cancer approximately 5 years ago and underwent bilateral mastectomies followed by chemotherapy and radiation to the right side.  She did not have radiation at that time.  She is interested in being around a C cup.  She does not smoke and is not diabetic.  Radiation changes on the right side are mild with fairly normal-appearing skin.   PMH Significant for: Hypothyroid, right side breast cancer, retinitis pigmentosa, fibromyalgia  The patient has not had problems with anesthesia.   Past Medical History: Allergies: Allergies  Allergen Reactions  . Sulfamethoxazole-Trimethoprim Rash    Current Medications:  Current Outpatient Medications:  .  atorvastatin (LIPITOR) 10 MG tablet, TK 1 T PO HS FOR HIGH CHOLESTEROL, Disp: , Rfl:  .  levothyroxine (SYNTHROID, LEVOTHROID) 50 MCG tablet, Take 1 tablet (50 mcg total) by mouth daily before breakfast. (Patient taking differently: Take 75 mcg by mouth daily before breakfast. ), Disp: 90 tablet, Rfl: 3 .  acetaminophen (TYLENOL) 500 MG tablet, Take 1 tablet (500 mg total) by mouth every 6 (six) hours as needed. For use AFTER surgery, Disp: 30 tablet, Rfl: 0 .  doxycycline (VIBRA-TABS) 100 MG tablet, Take 1 tablet (100 mg total) by mouth 2 (two) times daily for 5 days., Disp: 10 tablet, Rfl: 0 .  HYDROcodone-acetaminophen (NORCO) 5-325 MG tablet, Take 1 tablet by mouth every 8 (eight) hours as needed for  up to 7 days for severe pain. For use AFTER Surgery, Disp: 21 tablet, Rfl: 0 .  ibuprofen (ADVIL) 600 MG tablet, Take 1 tablet (600 mg total) by mouth every 6 (six) hours as needed for mild pain or moderate pain. For use AFTER surgery, Disp: 30 tablet, Rfl: 0 .  ondansetron (ZOFRAN) 4 MG tablet, Take 1 tablet (4 mg total) by mouth every 8 (eight) hours as needed for nausea or vomiting., Disp: 20 tablet, Rfl: 0  Past Medical Problems: Past Medical History:  Diagnosis Date  . Allergy   . Breast cancer ([place]) 2016   Right Breast  . Breast disorder    cancer  . Eye disease   . Fibromyalgia   . Retinitis pigmentosa   . 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 Thyroid disease    hypothyroid    Past Surgical History: Past Surgical History:  Procedure Laterality Date  . BREAST SURGERY    . CHOLECYSTECTOMY    . COLONOSCOPY N/A 11/27/2015   Procedure: COLONOSCOPY;  Surgeon: Gatha Mayer, MD;  Location: [facility];  Service: Endoscopy;  Laterality: N/A;  . MASTECTOMY Bilateral 2017  . PORT-A-CATH REMOVAL    . PORTACATH PLACEMENT      Social History: Social History   Socioeconomic History  . Marital status: Divorced    Spouse name: Not on file  . Number of children: 1  . Years of education: 28  . Highest education level: Not on file  Occupational History  . Occupation: disabled    Comment: vision impairment  Tobacco Use  . Smoking status: Never Smoker  . Smokeless tobacco: Never Used  . Tobacco comment: N/I  Vaping Use  . Vaping Use: Never used  Substance and Sexual Activity  . Alcohol use: No  . Drug use: No  . Sexual activity: Not Currently    Birth control/protection: None, Post-menopausal  Other Topics Concern  . Not on file  Social History Narrative   Lives at home BF   Vision impaired since age 64, has eye disease that disables from work   Does not Nurse, learning disability is 65, daughter, with new baby 2018   Social Determinants of Health   Financial  Resource Strain:   . Difficulty of Paying Living Expenses: Not on file  Food Insecurity:   . Worried About Charity fundraiser in the Last Year: Not on file  . Ran Out of Food in the Last Year: Not on file  Transportation Needs:   . Lack of Transportation (Medical): Not on file  . Lack of Transportation (Non-Medical): Not on file  Physical Activity:   . Days of Exercise per Week: Not on file  . Minutes of Exercise per Session: Not on file  Stress:   . Feeling of Stress : Not on file  Social Connections:   . Frequency of Communication with Friends and Family: Not on file  . Frequency of Social Gatherings with Friends and Family: Not on file  . Attends Religious Services: Not on file  . Active Member of Clubs or Organizations: Not on file  . Attends Archivist Meetings: Not on file  . Marital Status: Not on file  Intimate Partner Violence:   . Fear of Current or Ex-Partner: Not on file  . Emotionally Abused: Not on file  . Physically Abused: Not on file  . Sexually Abused: Not on file    Family History: Family History  Problem Relation Age of Onset  . Thyroid disease Mother   . Thyroid disease Sister   . Asthma Sister   . Thyroid disease Maternal Grandmother   . Emphysema Maternal Grandmother   . Cancer Maternal Grandmother        lung  . Alcohol abuse Maternal Grandfather   . Cancer Maternal Grandfather        pancreatic  . Liver disease Maternal Aunt     Review of Systems: Review of Systems  Constitutional: Negative for chills and fever.  HENT: Negative for congestion and sore throat.   Respiratory: Negative for cough and shortness of breath.   Cardiovascular: Negative for chest pain and palpitations.  Gastrointestinal: Negative for abdominal pain, nausea and vomiting.  Skin: Negative for itching and rash.    Physical Exam: Vital Signs BP 140/79 (BP Location: Left Arm, Patient Position: Sitting, Cuff Size: Large)   Pulse 92   Temp 98.3 F (36.8 C)  (Oral)   Ht 5\' 6"  (1.676 m)   Wt 194 lb 3.2 oz (88.1 kg)   SpO2 99%   BMI 31.34 kg/m  Physical Exam Constitutional:      General: She is not in acute distress.    Appearance: Normal appearance. She is not ill-appearing.  HENT:     Head: Normocephalic and atraumatic.  Cardiovascular:     Rate and Rhythm: Normal rate and regular rhythm.     Pulses: Normal pulses.     Heart sounds: Normal heart sounds.  Pulmonary:  Effort: Pulmonary effort is normal.     Breath sounds: Normal breath sounds. No wheezing, rhonchi or rales.  Chest:     Comments: Evidence of bilateral mastectomies with transverse scars.  Mild skin changes from radiation on the right side. Abdominal:     General: Bowel sounds are normal.     Palpations: Abdomen is soft.  Musculoskeletal:        General: No swelling. Normal range of motion.     Cervical back: Normal range of motion.  Skin:    General: Skin is warm and dry.     Coloration: Skin is not pale.     Findings: No erythema or rash.  Neurological:     General: No focal deficit present.     Mental Status: She is alert and oriented to person, place, and time.  Psychiatric:        Mood and Affect: Mood normal.        Behavior: Behavior normal.        Thought Content: Thought content normal.        Judgment: Judgment normal.     Assessment/Plan:  Ms. Wadas scheduled for bilateral reconstruction with placement of tissue expanders and Flex HD with Dr. Claudia Desanctis.  Risks, benefits, and alternatives of procedure discussed, questions answered and consent obtained.    Smoking Status: Non-smoker; Counseling Given?  N/A Last Mammogram: N/A; Results: Bilateral mastectomies approximately 5 years ago  Caprini Score: High; Risk Factors include: 49 year old female, Hx cancer, BMI > 25, and length of planned surgery. Recommendation for mechanical and pharmacological prophylaxis during surgery. Encourage early ambulation.   Post-op Rx sent to pharmacy: Norco, Doxy, Ibu,  Tylenol, zofran  Patient was provided with the Tissue expander risks and General Surgical Risk consent document and Pain Medication Agreement prior to their appointment.  They had adequate time to read through the risk consent documents and Pain Medication Agreement. We also discussed them in person together during this preop appointment. All of their questions were answered to their satisfaction.  Recommended calling if they have any further questions.  Risk consent form and Pain Medication Agreement to be scanned into patient's chart.  The risks that can be encountered with and after placement of a breast expander placement were discussed and include the following but not limited to these: bleeding, infection, delayed healing, anesthesia risks, skin sensation changes, injury to structures including nerves, blood vessels, and muscles which may be temporary or permanent, allergies to tape, suture materials and glues, blood products, topical preparations or injected agents, skin contour irregularities, skin discoloration and swelling, deep vein thrombosis, cardiac and pulmonary complications, pain, which may persist, fluid accumulation, wrinkling of the skin over the expander, changes in nipple or breast sensation, expander leakage or rupture, faulty position of the expander, persistent pain, formation of tight scar tissue around the expander (capsular contracture), possible need for revisional surgery or staged procedures.  Electronically signed by: Threasa Heads, PA-C 05/09/2020 10:56 AM

## 2020-05-10 ENCOUNTER — Encounter (HOSPITAL_BASED_OUTPATIENT_CLINIC_OR_DEPARTMENT_OTHER): Payer: Self-pay | Admitting: Plastic Surgery

## 2020-05-10 ENCOUNTER — Other Ambulatory Visit: Payer: Self-pay

## 2020-05-20 ENCOUNTER — Other Ambulatory Visit (HOSPITAL_COMMUNITY): Payer: Medicaid Other

## 2020-05-21 ENCOUNTER — Ambulatory Visit (HOSPITAL_BASED_OUTPATIENT_CLINIC_OR_DEPARTMENT_OTHER): Admission: RE | Admit: 2020-05-21 | Payer: Medicaid Other | Source: Home / Self Care | Admitting: Plastic Surgery

## 2020-05-21 HISTORY — DX: Hypothyroidism, unspecified: E03.9

## 2020-05-21 SURGERY — BREAST RECONSTRUCTION WITH PLACEMENT OF TISSUE EXPANDER AND FLEX HD (ACELLULAR HYDRATED DERMIS)
Anesthesia: General | Site: Breast | Laterality: Bilateral

## 2020-05-24 ENCOUNTER — Ambulatory Visit
Admission: EM | Admit: 2020-05-24 | Discharge: 2020-05-24 | Disposition: A | Discharge status: Home / Self Care | Payer: Medicaid Other | Attending: Emergency Medicine | Admitting: Emergency Medicine

## 2020-05-24 ENCOUNTER — Other Ambulatory Visit: Payer: Self-pay

## 2020-05-24 DIAGNOSIS — R062 Wheezing: Secondary | ICD-10-CM | POA: Diagnosis not present

## 2020-05-24 DIAGNOSIS — Z1152 Encounter for screening for COVID-19: Secondary | ICD-10-CM | POA: Diagnosis not present

## 2020-05-24 DIAGNOSIS — J069 Acute upper respiratory infection, unspecified: Secondary | ICD-10-CM

## 2020-05-24 MED ORDER — BENZONATATE 100 MG PO CAPS: 100.0000 mg | ORAL_CAPSULE | Freq: Three times a day (TID) | ORAL | 0 refills | Status: DC

## 2020-05-24 MED ORDER — PREDNISONE 10 MG PO TABS: 20.0000 mg | ORAL_TABLET | Freq: Every day | ORAL | 0 refills | Status: DC

## 2020-05-24 MED ORDER — ALBUTEROL SULFATE HFA 108 (90 BASE) MCG/ACT IN AERS
1.0000 | INHALATION_SPRAY | Freq: Four times a day (QID) | RESPIRATORY_TRACT | 0 refills | Status: DC | PRN
Start: 2020-05-24 — End: 2020-09-13

## 2020-05-24 MED ORDER — CETIRIZINE HCL 10 MG PO TABS: 10.0000 mg | ORAL_TABLET | Freq: Every day | ORAL | 0 refills | Status: DC

## 2020-05-24 NOTE — ED Triage Notes (Signed)
Pt presents with complaints of productive cough, wheezing, body aches, and chest congestion x 1 week. Pt denies any other symptoms. Denies relief with otc medications.

## 2020-05-24 NOTE — ED Provider Notes (Signed)
[facility]   [id number] 05/24/20 Arrival Time: 6568   CC: COVID symptoms  SUBJECTIVE: History from: patient and family.  Kathryn Skinner is a 49 y.o. female who presented to the urgent care with a complaint of productive cough, wheezing, nasal congestion, body aches and chest congestion for the past 1 week.  Denies sick exposure to COVID, flu or strep.  Denies recent travel.  Has tried OTC medication without relief.  No aggravating factors.  Denies previous symptoms in the past.   Denies fever, chills, fatigue, sinus pain, rhinorrhea, sore throat, wheezing, chest pain, nausea, changes in bowel or bladder habits.     ROS: As per HPI.  All other pertinent ROS negative.      Past Medical History:  Diagnosis Date  . Allergy   . Breast cancer ([place]) 2016   Right Breast  . Breast disorder    cancer  . Eye disease   . Fibromyalgia   . Hypothyroidism   . Retinitis pigmentosa   . 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 Thyroid disease    hypothyroid   Past Surgical History:  Procedure Laterality Date  . BREAST SURGERY    . CHOLECYSTECTOMY    . COLONOSCOPY N/A 11/27/2015   Procedure: COLONOSCOPY;  Surgeon: Gatha Mayer, MD;  Location: [facility];  Service: Endoscopy;  Laterality: N/A;  . MASTECTOMY Bilateral 2017  . PORT-A-CATH REMOVAL    . PORTACATH PLACEMENT     Allergies  Allergen Reactions  . Sulfamethoxazole-Trimethoprim Rash   No current facility-administered medications on file prior to encounter.   Current Outpatient Medications on File Prior to Encounter  Medication Sig Dispense Refill  . atorvastatin (LIPITOR) 10 MG tablet TK 1 T PO HS FOR HIGH CHOLESTEROL    . ibuprofen (ADVIL) 600 MG tablet Take 1 tablet (600 mg total) by mouth every 6 (six) hours as needed for mild pain or moderate pain. For use AFTER surgery 30 tablet 0  . levothyroxine (SYNTHROID, LEVOTHROID) 50 MCG tablet Take 1 tablet (50 mcg total) by mouth daily before  breakfast. (Patient taking differently: Take 75 mcg by mouth daily before breakfast. ) 90 tablet 3  . ondansetron (ZOFRAN) 4 MG tablet Take 1 tablet (4 mg total) by mouth every 8 (eight) hours as needed for nausea or vomiting. 20 tablet 0   Social History   Socioeconomic History  . Marital status: Divorced    Spouse name: Not on file  . Number of children: 1  . Years of education: 4  . Highest education level: Not on file  Occupational History  . Occupation: disabled    Comment: vision impairment  Tobacco Use  . Smoking status: Never Smoker  . Smokeless tobacco: Never Used  . Tobacco comment: N/I  Vaping Use  . Vaping Use: Never used  Substance and Sexual Activity  . Alcohol use: No  . Drug use: No  . Sexual activity: Not Currently    Birth control/protection: None, Post-menopausal  Other Topics Concern  . Not on file  Social History Narrative   Lives at home BF   Vision impaired since age 14, has eye disease that disables from work   Does not Nurse, learning disability is 52, daughter, with new baby 2018   Social Determinants of Health   Financial Resource Strain:   . Difficulty of Paying Living Expenses: Not on file  Food Insecurity:   . Worried About Charity fundraiser in the Last  Year: Not on file  . Ran Out of Food in the Last Year: Not on file  Transportation Needs:   . Lack of Transportation (Medical): Not on file  . Lack of Transportation (Non-Medical): Not on file  Physical Activity:   . Days of Exercise per Week: Not on file  . Minutes of Exercise per Session: Not on file  Stress:   . Feeling of Stress : Not on file  Social Connections:   . Frequency of Communication with Friends and Family: Not on file  . Frequency of Social Gatherings with Friends and Family: Not on file  . Attends Religious Services: Not on file  . Active Member of Clubs or Organizations: Not on file  . Attends Archivist Meetings: Not on file  . Marital Status: Not on file  Intimate  Partner Violence:   . Fear of Current or Ex-Partner: Not on file  . Emotionally Abused: Not on file  . Physically Abused: Not on file  . Sexually Abused: Not on file   Family History  Problem Relation Age of Onset  . Thyroid disease Mother   . Thyroid disease Sister   . Asthma Sister   . Thyroid disease Maternal Grandmother   . Emphysema Maternal Grandmother   . Cancer Maternal Grandmother        lung  . Alcohol abuse Maternal Grandfather   . Cancer Maternal Grandfather        pancreatic  . Liver disease Maternal Aunt     OBJECTIVE:  Vitals:   05/24/20 1615  BP: 125/76  Pulse: 95  Resp: 16  Temp: 98.4 F (36.9 C)  SpO2: 97%     General appearance: alert; appears fatigued, but nontoxic; speaking in full sentences and tolerating own secretions HEENT: NCAT; Ears: EACs clear, TMs pearly gray; Eyes: PERRL.  EOM grossly intact. Sinuses: nontender; Nose: nares patent without rhinorrhea, Throat: oropharynx clear, tonsils non erythematous or enlarged, uvula midline  Neck: supple without LAD Lungs: unlabored respirations, symmetrical air entry; cough: moderate; no respiratory distress; CTAB Heart: regular rate and rhythm.  Radial pulses 2+ symmetrical bilaterally Skin: warm and dry Psychological: alert and cooperative; normal mood and affect  LABS:  No results found for this or any previous visit (from the past 24 hour(s)).   ASSESSMENT & PLAN:  1. Encounter for screening for COVID-19   2. URI with cough and congestion   3. Wheezing     Meds ordered this encounter  Medications  . albuterol (VENTOLIN HFA) 108 (90 Base) MCG/ACT inhaler    Sig: Inhale 1-2 puffs into the lungs every 6 (six) hours as needed for wheezing or shortness of breath.    Dispense:  18 g    Refill:  0  . predniSONE (DELTASONE) 10 MG tablet    Sig: Take 2 tablets (20 mg total) by mouth daily.    Dispense:  15 tablet    Refill:  0  . benzonatate (TESSALON) 100 MG capsule    Sig: Take 1 capsule  (100 mg total) by mouth every 8 (eight) hours.    Dispense:  30 capsule    Refill:  0  . cetirizine (ZYRTEC ALLERGY) 10 MG tablet    Sig: Take 1 tablet (10 mg total) by mouth daily.    Dispense:  30 tablet    Refill:  0    Discharge instructions  COVID testing ordered.  It will take between 2-7 days for test results.  Someone will contact you regarding  abnormal results.    In the meantime: You should remain isolated in your home for 10 days from symptom onset AND greater than 24 hours after symptoms resolution (absence of fever without the use of fever-reducing medication and improvement in respiratory symptoms), whichever is longer Get plenty of rest and push fluids Tessalon Perles prescribed for cough Prescribed zyrtec for nasal congestion, runny nose, and/or sore throat Prescribed ProAir Prescribed prednisone for wheezing Use medications daily for symptom relief Use OTC medications like ibuprofen or tylenol as needed fever or pain Call or go to the ED if you have any new or worsening symptoms such as fever, worsening cough, shortness of breath, chest tightness, chest pain, turning blue, changes in mental status, etc...   Reviewed expectations re: course of current medical issues. Questions answered. Outlined signs and symptoms indicating need for more acute intervention. Patient verbalized understanding. After Visit Summary given.         Emerson Monte, [place] 05/24/20 1701

## 2020-05-24 NOTE — Discharge Instructions (Signed)
COVID testing ordered.  It will take between 2-7 days for test results.  Someone will contact you regarding abnormal results.    In the meantime: You should remain isolated in your home for 10 days from symptom onset AND greater than 24 hours after symptoms resolution (absence of fever without the use of fever-reducing medication and improvement in respiratory symptoms), whichever is longer Get plenty of rest and push fluids Tessalon Perles prescribed for cough Prescribed zyrtec for nasal congestion, runny nose, and/or sore throat Prescribed ProAir Prescribed prednisone for wheezing Use medications daily for symptom relief Use OTC medications like ibuprofen or tylenol as needed fever or pain Call or go to the ED if you have any new or worsening symptoms such as fever, worsening cough, shortness of breath, chest tightness, chest pain, turning blue, changes in mental status, etc..Marland Kitchen

## 2020-05-25 LAB — SARS-COV-2, NAA 2 DAY TAT

## 2020-05-25 LAB — NOVEL CORONAVIRUS, NAA: SARS-CoV-2, NAA: NOT DETECTED

## 2020-05-30 ENCOUNTER — Encounter: Payer: Medicaid Other | Admitting: Plastic Surgery

## 2020-06-06 ENCOUNTER — Encounter: Payer: Medicaid Other | Admitting: Plastic Surgery

## 2020-06-18 DIAGNOSIS — E039 Hypothyroidism, unspecified: Secondary | ICD-10-CM | POA: Diagnosis not present

## 2020-06-18 DIAGNOSIS — E785 Hyperlipidemia, unspecified: Secondary | ICD-10-CM | POA: Diagnosis not present

## 2020-06-25 DIAGNOSIS — E039 Hypothyroidism, unspecified: Secondary | ICD-10-CM | POA: Diagnosis not present

## 2020-06-25 DIAGNOSIS — E785 Hyperlipidemia, unspecified: Secondary | ICD-10-CM | POA: Diagnosis not present

## 2020-06-25 DIAGNOSIS — Z79899 Other long term (current) drug therapy: Secondary | ICD-10-CM | POA: Diagnosis not present

## 2020-08-21 ENCOUNTER — Other Ambulatory Visit: Payer: Self-pay

## 2020-08-21 ENCOUNTER — Encounter: Payer: Self-pay | Admitting: Nurse Practitioner

## 2020-08-21 ENCOUNTER — Ambulatory Visit: Payer: Medicaid Other | Admitting: Nurse Practitioner

## 2020-08-21 VITALS — BP 130/80 | HR 90 | Temp 98.0°F | Resp 20 | Ht 66.0 in | Wt 191.0 lb

## 2020-08-21 DIAGNOSIS — Z7689 Persons encountering health services in other specified circumstances: Secondary | ICD-10-CM

## 2020-08-21 DIAGNOSIS — E039 Hypothyroidism, unspecified: Secondary | ICD-10-CM | POA: Diagnosis not present

## 2020-08-21 DIAGNOSIS — E785 Hyperlipidemia, unspecified: Secondary | ICD-10-CM | POA: Insufficient documentation

## 2020-08-21 DIAGNOSIS — Z139 Encounter for screening, unspecified: Secondary | ICD-10-CM

## 2020-08-21 DIAGNOSIS — M797 Fibromyalgia: Secondary | ICD-10-CM | POA: Diagnosis not present

## 2020-08-21 DIAGNOSIS — Z171 Estrogen receptor negative status [ER-]: Secondary | ICD-10-CM | POA: Diagnosis not present

## 2020-08-21 DIAGNOSIS — C50911 Malignant neoplasm of unspecified site of right female breast: Secondary | ICD-10-CM | POA: Diagnosis not present

## 2020-08-21 DIAGNOSIS — C50912 Malignant neoplasm of unspecified site of left female breast: Secondary | ICD-10-CM | POA: Diagnosis not present

## 2020-08-21 DIAGNOSIS — Z853 Personal history of malignant neoplasm of breast: Secondary | ICD-10-CM

## 2020-08-21 DIAGNOSIS — M79604 Pain in right leg: Secondary | ICD-10-CM | POA: Diagnosis not present

## 2020-08-21 DIAGNOSIS — R079 Chest pain, unspecified: Secondary | ICD-10-CM | POA: Diagnosis not present

## 2020-08-21 HISTORY — DX: Fibromyalgia: M79.7

## 2020-08-21 HISTORY — DX: Hyperlipidemia, unspecified: E78.5

## 2020-08-21 MED ORDER — LEVOTHYROXINE SODIUM 75 MCG PO TABS: 75.0000 ug | ORAL_TABLET | Freq: Every day | ORAL | 1 refills | Status: DC

## 2020-08-21 NOTE — Addendum Note (Signed)
Addended by: Demetrius Revel on: 08/21/2020 10:29 AM   Modules accepted: Orders

## 2020-08-21 NOTE — Progress Notes (Addendum)
New Patient Office Visit  Subjective:  Patient ID: Kathryn Skinner, female    DOB: May 30, 1971  Age: 50 y.o. MRN: [id number]  CC:  Chief Complaint  Patient presents with  . New Patient (Initial Visit)    HPI Kathryn Skinner presents for new patient visit. Transferring care from [facility] clinic. Last physical was in June 2021. Her last PAP was 3 years ago. Last labs were drawn 3 months ago.  She is concerned with sharp pain near her port site. She had left chest port, but it was removed years ago. She has hx of breast cancer. She has finished her treatments and is seeing oncology q6 months.  She states that the sharp pain is intermittent and lasts about a minute, but it is intense enough to wake her up at night.  She states it is sharp and feels like flashes of pain. She has 6-7 episodes of pain per day in her left upper chest. She states that sometimes she has SOB, fatigue, and nausea associated with the pain.  Past Medical History:  Diagnosis Date  . Allergy   . Breast cancer ([place]) 2016   Right Breast  . Breast cancer ([place])       . Breast disorder    cancer  . Eye disease   . Fibromyalgia   . Fibromyalgia 08/21/2020  . Hypothyroidism   . Retinitis pigmentosa   . 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 Thyroid disease    hypothyroid    Past Surgical History:  Procedure Laterality Date  . BREAST SURGERY    . CHOLECYSTECTOMY    . COLONOSCOPY N/A 11/27/2015   Procedure: COLONOSCOPY;  Surgeon: Gatha Mayer, MD;  Location: [facility];  Service: Endoscopy;  Laterality: N/A;  . MASTECTOMY Bilateral 2017  . PORT-A-CATH REMOVAL    . PORTACATH PLACEMENT      Family History  Problem Relation Age of Onset  . Thyroid disease Mother   . Thyroid disease Sister   . Asthma Sister   . Thyroid disease Maternal Grandmother   . Emphysema Maternal Grandmother   . Cancer Maternal Grandmother        lung  . Alcohol abuse Maternal Grandfather   . Cancer Maternal  Grandfather        pancreatic  . Liver disease Maternal Aunt     Social History   Socioeconomic History  . Marital status: Divorced    Spouse name: Not on file  . Number of children: 1  . Years of education: 70  . Highest education level: Not on file  Occupational History  . Occupation: disabled    Comment: vision impairment  Tobacco Use  . Smoking status: Never Smoker  . Smokeless tobacco: Never Used  . Tobacco comment: N/I  Vaping Use  . Vaping Use: Never used  Substance and Sexual Activity  . Alcohol use: No  . Drug use: No  . Sexual activity: Not Currently    Birth control/protection: None, Post-menopausal  Other Topics Concern  . Not on file  Social History Narrative   Lives at home BF   Vision impaired since age 29, has eye disease that disables from work   Does not Nurse, learning disability is 57, daughter, with new baby 2018   Social Determinants of Health   Financial Resource Strain: Not on file  Food Insecurity: Not on file  Transportation Needs: Not on file  Physical Activity: Not on file  Stress: Not  on file  Social Connections: Not on file  Intimate Partner Violence: Not on file    ROS Review of Systems  Constitutional: Negative.   Respiratory: Negative.   Cardiovascular: Positive for chest pain.  Musculoskeletal: Positive for myalgias.  Psychiatric/Behavioral: Negative.     Objective:   Today's Vitals: BP 130/80   Pulse 90   Temp 98 F (36.7 C)   Resp 20   Ht 5\' 6"  (1.676 m)   Wt 191 lb (86.6 kg)   SpO2 98%   BMI 30.83 kg/m   Physical Exam Constitutional:      Appearance: Normal appearance.  Cardiovascular:     Rate and Rhythm: Normal rate and regular rhythm.     Pulses: Normal pulses.     Heart sounds: Normal heart sounds.  Pulmonary:     Effort: Pulmonary effort is normal.     Breath sounds: Normal breath sounds.  Musculoskeletal:     Comments: Negative straight leg raises; negative right hip pain with joint manipulation   Neurological:     Mental Status: She is alert.  Psychiatric:        Mood and Affect: Mood normal.        Behavior: Behavior normal.        Thought Content: Thought content normal.        Judgment: Judgment normal.     Assessment & Plan:   Problem List Items Addressed This Visit      Endocrine   Hypothyroidism    -takes levothyroxine 50 mcg daily        Other   History of breast cancer    -followed by oncology      Encounter to establish care    -transferring from [facility] clinic -obtain records      Chest pain    -comes and goes, not affecting her currently -has nausea, fatigue, and SOB and at times is so severe that it wakes her up -Referral to cardiology to r/o cardiogenic causes of her chest pain -if cardiac work-up is negative, she had left chest port placement, may be related to scar tissue or her fibromyalgia      Relevant Orders   Ambulatory referral to Cardiology   Fibromyalgia    -no current meds      Right leg pain    -goes down right leg -negative straight leg raise, no pain with ROM exercises -may be related to fibro; acute onset -try ibuprofen and/or tylenol until next appt in 2 weeks      RESOLVED: Breast cancer [facility])      Outpatient Encounter Medications as of 08/21/2020  Medication Sig  . albuterol (VENTOLIN HFA) 108 (90 Base) MCG/ACT inhaler Inhale 1-2 puffs into the lungs every 6 (six) hours as needed for wheezing or shortness of breath.  Marland Kitchen atorvastatin (LIPITOR) 10 MG tablet TK 1 T PO HS FOR HIGH CHOLESTEROL  . cetirizine (ZYRTEC ALLERGY) 10 MG tablet Take 1 tablet (10 mg total) by mouth daily.  Marland Kitchen levothyroxine (SYNTHROID, LEVOTHROID) 50 MCG tablet Take 1 tablet (50 mcg total) by mouth daily before breakfast. (Patient taking differently: Take 75 mcg by mouth daily before breakfast.)  . benzonatate (TESSALON) 100 MG capsule Take 1 capsule (100 mg total) by mouth every 8 (eight) hours. (Patient not taking: Reported on 08/21/2020)  . ibuprofen  (ADVIL) 600 MG tablet Take 1 tablet (600 mg total) by mouth every 6 (six) hours as needed for mild pain or moderate pain. For use AFTER surgery (Patient  not taking: Reported on 08/21/2020)  . ondansetron (ZOFRAN) 4 MG tablet Take 1 tablet (4 mg total) by mouth every 8 (eight) hours as needed for nausea or vomiting. (Patient not taking: Reported on 08/21/2020)  . predniSONE (DELTASONE) 10 MG tablet Take 2 tablets (20 mg total) by mouth daily. (Patient not taking: Reported on 08/21/2020)   No facility-administered encounter medications on file as of 08/21/2020.    Follow-up: Return in about 1 week (around 08/28/2020) for Lab follow-up (fasting labs 2-3 days prior to appt.).   Noreene Larsson, NP

## 2020-08-21 NOTE — Assessment & Plan Note (Signed)
-  takes levothyroxine 50 mcg daily

## 2020-08-21 NOTE — Patient Instructions (Signed)
For your chest pain, I referred you to cardiology to make sure you don't have any heart issues. Take OTC tylenol and/or ibuprofen until our next appointment. We will look at labs and consider treatment if leg pain persists.   If your chest pain gets worse, I recommend going to the emergency department for a full cardiac work-up.

## 2020-08-21 NOTE — Assessment & Plan Note (Signed)
-  transferring from [facility] clinic -obtain records

## 2020-08-21 NOTE — Assessment & Plan Note (Signed)
followed by oncology.  °

## 2020-08-21 NOTE — Assessment & Plan Note (Signed)
-  goes down right leg -negative straight leg raise, no pain with ROM exercises -may be related to fibro; acute onset -try ibuprofen and/or tylenol until next appt in 2 weeks

## 2020-08-21 NOTE — Assessment & Plan Note (Signed)
-  takes atorvastatin 10 mg daily

## 2020-08-21 NOTE — Assessment & Plan Note (Signed)
-  comes and goes, not affecting her currently -has nausea, fatigue, and SOB and at times is so severe that it wakes her up -Referral to cardiology to r/o cardiogenic causes of her chest pain -if cardiac work-up is negative, she had left chest port placement, may be related to scar tissue or her fibromyalgia

## 2020-08-21 NOTE — Assessment & Plan Note (Signed)
-  no current meds

## 2020-08-27 DIAGNOSIS — Z7689 Persons encountering health services in other specified circumstances: Secondary | ICD-10-CM | POA: Diagnosis not present

## 2020-08-27 DIAGNOSIS — E785 Hyperlipidemia, unspecified: Secondary | ICD-10-CM | POA: Diagnosis not present

## 2020-08-27 DIAGNOSIS — R079 Chest pain, unspecified: Secondary | ICD-10-CM | POA: Diagnosis not present

## 2020-08-27 DIAGNOSIS — Z139 Encounter for screening, unspecified: Secondary | ICD-10-CM | POA: Diagnosis not present

## 2020-08-27 DIAGNOSIS — E039 Hypothyroidism, unspecified: Secondary | ICD-10-CM | POA: Diagnosis not present

## 2020-08-28 ENCOUNTER — Other Ambulatory Visit: Payer: Self-pay | Admitting: Nurse Practitioner

## 2020-08-28 DIAGNOSIS — E039 Hypothyroidism, unspecified: Secondary | ICD-10-CM

## 2020-08-28 LAB — CBC WITH DIFFERENTIAL/PLATELET
Basophils Absolute: 0.1 10*3/uL (ref 0.0–0.2)
Basos: 1 %
EOS (ABSOLUTE): 0.2 10*3/uL (ref 0.0–0.4)
Eos: 2 %
Hematocrit: 38 % (ref 34.0–46.6)
Hemoglobin: 12.5 g/dL (ref 11.1–15.9)
Immature Grans (Abs): 0 10*3/uL (ref 0.0–0.1)
Immature Granulocytes: 0 %
Lymphocytes Absolute: 2.2 10*3/uL (ref 0.7–3.1)
Lymphs: 33 %
MCH: 28.2 pg (ref 26.6–33.0)
MCHC: 32.9 g/dL (ref 31.5–35.7)
MCV: 86 fL (ref 79–97)
Monocytes Absolute: 0.8 10*3/uL (ref 0.1–0.9)
Monocytes: 12 %
Neutrophils Absolute: 3.4 10*3/uL (ref 1.4–7.0)
Neutrophils: 52 %
Platelets: 261 10*3/uL (ref 150–450)
RBC: 4.44 x10E6/uL (ref 3.77–5.28)
RDW: 13.7 % (ref 11.7–15.4)
WBC: 6.7 10*3/uL (ref 3.4–10.8)

## 2020-08-28 LAB — LIPID PANEL WITH LDL/HDL RATIO
Cholesterol, Total: 169 mg/dL (ref 100–199)
HDL: 57 mg/dL (ref >39)
LDL Chol Calc (NIH): 87 mg/dL (ref 0–99)
LDL/HDL Ratio: 1.5 ratio (ref 0.0–3.2)
Triglycerides: 146 mg/dL (ref 0–149)
VLDL Cholesterol Cal: 25 mg/dL (ref 5–40)

## 2020-08-28 LAB — CMP14+EGFR
ALT: 12 IU/L (ref 0–32)
AST: 16 IU/L (ref 0–40)
Albumin/Globulin Ratio: 1.4 (ref 1.2–2.2)
Albumin: 4 g/dL (ref 3.8–4.8)
Alkaline Phosphatase: 120 IU/L (ref 44–121)
BUN/Creatinine Ratio: 22 (ref 9–23)
BUN: 17 mg/dL (ref 6–24)
Bilirubin Total: 0.3 mg/dL (ref 0.0–1.2)
CO2: 21 mmol/L (ref 20–29)
Calcium: 9.1 mg/dL (ref 8.7–10.2)
Chloride: 105 mmol/L (ref 96–106)
Creatinine, Ser: 0.78 mg/dL (ref 0.57–1.00)
Globulin, Total: 2.9 g/dL (ref 1.5–4.5)
Glucose: 88 mg/dL (ref 65–99)
Potassium: 4.9 mmol/L (ref 3.5–5.2)
Sodium: 140 mmol/L (ref 134–144)
Total Protein: 6.9 g/dL (ref 6.0–8.5)
eGFR: 93 mL/min/{1.73_m2} (ref >59)

## 2020-08-28 LAB — TSH+FREE T4
Free T4: 1.29 ng/dL (ref 0.82–1.77)
TSH: 5.1 u[IU]/mL — ABNORMAL HIGH (ref 0.450–4.500)

## 2020-08-28 LAB — HCV INTERPRETATION

## 2020-08-28 LAB — HCV AB W/RFLX TO VERIFICATION: HCV Ab: <0.1 s/co ratio (ref 0.0–0.9)

## 2020-08-28 MED ORDER — LEVOTHYROXINE SODIUM 88 MCG PO TABS: 88.0000 ug | ORAL_TABLET | Freq: Every day | ORAL | 1 refills | Status: DC

## 2020-08-28 NOTE — Progress Notes (Signed)
Her TSH was slightly elevated, so we need to increase her levothyroxine. Icalle din a new rx for 88 mcg. We will recheck this in a month.

## 2020-09-02 ENCOUNTER — Encounter: Payer: Self-pay | Admitting: Nurse Practitioner

## 2020-09-02 ENCOUNTER — Ambulatory Visit: Payer: Medicaid Other | Admitting: Nurse Practitioner

## 2020-09-02 ENCOUNTER — Other Ambulatory Visit: Payer: Self-pay

## 2020-09-02 DIAGNOSIS — M797 Fibromyalgia: Secondary | ICD-10-CM

## 2020-09-02 DIAGNOSIS — R079 Chest pain, unspecified: Secondary | ICD-10-CM

## 2020-09-02 DIAGNOSIS — E039 Hypothyroidism, unspecified: Secondary | ICD-10-CM

## 2020-09-02 NOTE — Progress Notes (Signed)
Established Patient Office Visit  Subjective:  Patient ID: Kathryn Skinner, female    DOB: [birth year]  Age: 50 y.o. MRN: [id number]  CC:  Chief Complaint  Patient presents with  . Follow-up    Go over labs.     HPI Kathryn Skinner presents for lab follow up.  She has been concerned with sharp pain near her previous port site. She had left chest port, but it was removed years ago. She has hx of breast cancer. She has finished her treatments and is seeing oncology q6 months.  She states that the sharp pain is intermittent and lasts about a minute, but it is intense enough to wake her up at night.  She states it is sharp and feels like flashes of pain. She has 6-7 episodes of pain per day in her left upper chest. She states that sometimes she has SOB, fatigue, and nausea associated with the pain. Today, she states that she notices that she has more pain after picking up her granddaughter.  She was referred to cardiology, and has an appointment with Dr. Harrington Challenger in about 2 weeks.  Past Medical History:  Diagnosis Date  . Allergy   . Breast cancer ([place]) 2016   Right Breast  . Breast cancer ([place])       . Breast disorder    cancer  . Eye disease   . Fibromyalgia   . Fibromyalgia 08/21/2020  . Hyperlipidemia 08/21/2020  . Hypothyroidism   . Retinitis pigmentosa   . 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 Thyroid disease    hypothyroid    Past Surgical History:  Procedure Laterality Date  . BREAST SURGERY    . CHOLECYSTECTOMY    . COLONOSCOPY N/A 11/27/2015   Procedure: COLONOSCOPY;  Surgeon: Gatha Mayer, MD;  Location: [facility];  Service: Endoscopy;  Laterality: N/A;  . MASTECTOMY Bilateral 2017  . PORT-A-CATH REMOVAL    . PORTACATH PLACEMENT      Family History  Problem Relation Age of Onset  . Thyroid disease Mother   . Thyroid disease Sister   . Asthma Sister   . Thyroid disease Maternal Grandmother   . Emphysema Maternal Grandmother   . Cancer  Maternal Grandmother        lung  . Alcohol abuse Maternal Grandfather   . Cancer Maternal Grandfather        pancreatic  . Liver disease Maternal Aunt     Social History   Socioeconomic History  . Marital status: Divorced    Spouse name: Not on file  . Number of children: 1  . Years of education: 33  . Highest education level: Not on file  Occupational History  . Occupation: disabled    Comment: vision impairment  Tobacco Use  . Smoking status: Never Smoker  . Smokeless tobacco: Never Used  . Tobacco comment: N/I  Vaping Use  . Vaping Use: Never used  Substance and Sexual Activity  . Alcohol use: No  . Drug use: No  . Sexual activity: Not Currently    Birth control/protection: None, Post-menopausal  Other Topics Concern  . Not on file  Social History Narrative   Lives at home BF   Vision impaired since age 66, has eye disease that disables from work   Does not Nurse, learning disability is 78, daughter, with new baby 2018   Social Determinants of Health   Financial Resource Strain: Not on file  Food Insecurity:  Not on file  Transportation Needs: Not on file  Physical Activity: Not on file  Stress: Not on file  Social Connections: Not on file  Intimate Partner Violence: Not on file    Outpatient Medications Prior to Visit  Medication Sig Dispense Refill  . albuterol (VENTOLIN HFA) 108 (90 Base) MCG/ACT inhaler Inhale 1-2 puffs into the lungs every 6 (six) hours as needed for wheezing or shortness of breath. 18 g 0  . atorvastatin (LIPITOR) 10 MG tablet TK 1 T PO HS FOR HIGH CHOLESTEROL    . cetirizine (ZYRTEC ALLERGY) 10 MG tablet Take 1 tablet (10 mg total) by mouth daily. 30 tablet 0  . levothyroxine (SYNTHROID) 88 MCG tablet Take 1 tablet (88 mcg total) by mouth daily before breakfast. 90 tablet 1   No facility-administered medications prior to visit.    Allergies  Allergen Reactions  . Sulfamethoxazole-Trimethoprim Rash    ROS Review of Systems  Constitutional:  Negative.   Respiratory: Negative.   Cardiovascular: Positive for chest pain. Negative for palpitations and leg swelling.       No acute CP today, but gets episodes so severe that they wake her at night at times  Musculoskeletal: Negative.   Psychiatric/Behavioral: Negative.       Objective:    Physical Exam Constitutional:      Appearance: Normal appearance.  Cardiovascular:     Rate and Rhythm: Normal rate and regular rhythm.     Pulses: Normal pulses.     Heart sounds: Normal heart sounds.  Pulmonary:     Effort: Pulmonary effort is normal.     Breath sounds: Normal breath sounds.  Neurological:     Mental Status: She is alert.  Psychiatric:        Mood and Affect: Mood normal.        Behavior: Behavior normal.        Thought Content: Thought content normal.        Judgment: Judgment normal.     BP 120/83   Pulse 86   Temp 98.4 F (36.9 C)   Resp 20   Ht 5\' 6"  (1.676 m)   Wt 191 lb (86.6 kg)   SpO2 99%   BMI 30.83 kg/m  Wt Readings from Last 3 Encounters:  09/02/20 191 lb (86.6 kg)  08/21/20 191 lb (86.6 kg)  05/08/20 194 lb 3.2 oz (88.1 kg)     Health Maintenance Due  Topic Date Due  . COVID-19 Vaccine (1) Never done    There are no preventive care reminders to display for this patient.  Lab Results  Component Value Date   TSH 5.100 (H) 08/27/2020   Lab Results  Component Value Date   WBC 6.7 08/27/2020   HGB 12.5 08/27/2020   HCT 38.0 08/27/2020   MCV 86 08/27/2020   PLT 261 08/27/2020   Lab Results  Component Value Date   NA 140 08/27/2020   K 4.9 08/27/2020   CO2 21 08/27/2020   GLUCOSE 88 08/27/2020   BUN 17 08/27/2020   CREATININE 0.78 08/27/2020   BILITOT 0.3 08/27/2020   ALKPHOS 120 08/27/2020   AST 16 08/27/2020   ALT 12 08/27/2020   PROT 6.9 08/27/2020   ALBUMIN 4.0 08/27/2020   CALCIUM 9.1 08/27/2020   ANIONGAP 10 07/23/2017   Lab Results  Component Value Date   CHOL 169 08/27/2020   Lab Results  Component Value  Date   HDL 57 08/27/2020   Lab Results  Component  Value Date   LDLCALC 87 08/27/2020   Lab Results  Component Value Date   TRIG 146 08/27/2020   Lab Results  Component Value Date   CHOLHDL 3.4 04/29/2017   No results found for: HGBA1C    Assessment & Plan:   Problem List Items Addressed This Visit      Endocrine   Hypothyroidism    -was taking levothyroxine 50 mcg x 1.5 tabs  Lab Results  Component Value Date   TSH 5.100 (H) 08/27/2020   -Levothyroxine INCREASED to 88 mcg daily        Relevant Orders   TSH + free T4     Other   Chest pain    -has upcoming appointment with Dr. Harrington Challenger -discussed red flags to go to ED -if stress test is negative, may consider treatment for fibro like symbalta -her pain may be MSK-related from lifting her granddaughter, but no reproducible pain with ROM exercises today      Fibromyalgia    -if CP is not cardiogenic, may consider low-dose tricyclic which is first line for fibromyalgia as well as getting her in an exercise routine; may consider PT referral to teach her some exercises to help -may also benefit from CBT         No orders of the defined types were placed in this encounter.   Follow-up: Return in about 1 month (around 10/03/2020) for Lab follow-up (thyroid).    Noreene Larsson, NP

## 2020-09-02 NOTE — Assessment & Plan Note (Signed)
-  has upcoming appointment with Dr. Harrington Challenger -discussed red flags to go to ED -if stress test is negative, may consider treatment for fibro like symbalta -her pain may be MSK-related from lifting her granddaughter, but no reproducible pain with ROM exercises today

## 2020-09-02 NOTE — Assessment & Plan Note (Signed)
-  if CP is not cardiogenic, may consider low-dose tricyclic which is first line for fibromyalgia as well as getting her in an exercise routine; may consider PT referral to teach her some exercises to help -may also benefit from CBT

## 2020-09-02 NOTE — Patient Instructions (Signed)
Since we are changing thyroid meds today, we will recheck thyroid labs in 1 month.   You do not have to be fasting for labs for the next draw. If you have them done 2-3 days prior to you appointment, we will be able to discuss them at that time.  For chest pain, if you have shortness of breath, sweating, or severe fatigue, you should go to an emergency department immediately for a full cardiac workup.

## 2020-09-02 NOTE — Assessment & Plan Note (Signed)
-  was taking levothyroxine 50 mcg x 1.5 tabs  Lab Results  Component Value Date   TSH 5.100 (H) 08/27/2020   -Levothyroxine INCREASED to 88 mcg daily

## 2020-09-13 ENCOUNTER — Other Ambulatory Visit: Payer: Self-pay

## 2020-09-13 ENCOUNTER — Encounter: Payer: Self-pay | Admitting: Internal Medicine

## 2020-09-13 ENCOUNTER — Ambulatory Visit (INDEPENDENT_AMBULATORY_CARE_PROVIDER_SITE_OTHER): Payer: Medicaid Other | Admitting: Internal Medicine

## 2020-09-13 VITALS — BP 122/76 | HR 86 | Ht 66.0 in | Wt 193.0 lb

## 2020-09-13 DIAGNOSIS — R0602 Shortness of breath: Secondary | ICD-10-CM

## 2020-09-13 NOTE — Patient Instructions (Signed)
Medication Instructions:  Your physician recommends that you continue on your current medications as directed. Please refer to the Current Medication list given to you today.  *If you need a refill on your cardiac medications before your next appointment, please call your pharmacy*   Lab Work: NONE   If you have labs (blood work) drawn today and your tests are completely normal, you will receive your results only by: Marland Kitchen MyChart Message (if you have MyChart) OR . A paper copy in the mail If you have any lab test that is abnormal or we need to change your treatment, we will call you to review the results.   Testing/Procedures: Your physician has requested that you have an echocardiogram. Echocardiography is a painless test that uses sound waves to create images of your heart. It provides your doctor with information about the size and shape of your heart and how well your heart's chambers and valves are working. This procedure takes approximately one hour. There are no restrictions for this procedure.   Follow-Up: At [facility], you and your health needs are our priority.  As part of our continuing mission to provide you with exceptional heart care, we have created designated Provider Care Teams.  These Care Teams include your primary Cardiologist (physician) and Advanced Practice Providers (APPs -  Physician Assistants and Nurse Practitioners) who all work together to provide you with the care you need, when you need it.  We recommend signing up for the patient portal called "MyChart".  Sign up information is provided on this After Visit Summary.  MyChart is used to connect with patients for Virtual Visits (Telemedicine).  Patients are able to view lab/test results, encounter notes, upcoming appointments, etc.  Non-urgent messages can be sent to your provider as well.   To learn more about what you can do with MyChart, go to [email or web address].    Your next appointment:    Pending  Test Results   The format for your next appointment:   In Person  Provider:   Dorris Carnes, MD   Other Instructions Thank you for choosing [facility]!

## 2020-09-13 NOTE — Progress Notes (Signed)
Cardiology Office Note   Date:  09/13/2020   ID:  Kathryn Skinner, DOB 02-Nov-1970, MRN [id number]  PCP:  Noreene Larsson, NP  Cardiologist:   Dorris Carnes, MD   Pt referred by Richardean Canal for evaluation of CP     History of Present Illness: Kathryn Skinner is a 50 y.o. female with hx of R breast CA (s/p bilateral mastectomy and chemoRx 2016 ) but no prior cardiac hx   About 1 month ago he developed  The pains were R and L sided   Then became L sided   Pain constant  Goes up to L underarm.    Pt also notes some SOB   SOme dizziness but no syncope  CP is not associated with activty      The pt is busy, cares for grandchild who is 40 some pounds   LIfts her often  Diet:  Beakfast:  Cereal (cheeroos, fruity pebbles;  Lunch  Left overs;   Photographer and veg   Drinks coffee with sugar)      Current Meds  Medication Sig  . atorvastatin (LIPITOR) 10 MG tablet TK 1 T PO HS FOR HIGH CHOLESTEROL  . levothyroxine (SYNTHROID) 88 MCG tablet Take 1 tablet (88 mcg total) by mouth daily before breakfast.     Allergies:   Sulfamethoxazole-trimethoprim   Past Medical History:  Diagnosis Date  . Allergy   . Breast cancer ([place]) 2016   Right Breast  . Breast cancer ([place])       . Breast disorder    cancer  . Eye disease   . Fibromyalgia   . Fibromyalgia 08/21/2020  . Hyperlipidemia 08/21/2020  . Hypothyroidism   . Retinitis pigmentosa   . 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 Thyroid disease    hypothyroid    Past Surgical History:  Procedure Laterality Date  . BREAST SURGERY    . CHOLECYSTECTOMY    . COLONOSCOPY N/A 11/27/2015   Procedure: COLONOSCOPY;  Surgeon: Gatha Mayer, MD;  Location: [facility];  Service: Endoscopy;  Laterality: N/A;  . MASTECTOMY Bilateral 2017  . PORT-A-CATH REMOVAL    . PORTACATH PLACEMENT       Social History:  The patient  reports that she has never smoked. She has never used smokeless tobacco. She reports that she does not drink  alcohol and does not use drugs.   Family History:  The patient's family history includes Alcohol abuse in her maternal grandfather; Asthma in her sister; Cancer in her maternal grandfather and maternal grandmother; Emphysema in her maternal grandmother; Liver disease in her maternal aunt; Thyroid disease in her maternal grandmother, mother, and sister.    ROS:  Please see the history of present illness. All other systems are reviewed and  Negative to the above problem except as noted.    PHYSICAL EXAM: VS:  BP 122/76 (BP Location: Right Arm, Patient Position: Sitting, Cuff Size: Normal)   Pulse 86   Ht 5\' 6"  (1.676 m)   Wt 193 lb (87.5 kg)   SpO2 96%   BMI 31.15 kg/m   GEN: OBese 50 yo female, in no acute distress  HEENT: normal  Neck: no JVD, carotid bruits, Cardiac: RRR; no murmurs  No LE  edema  Chest  S/p bilateral mastectomy   L chest tender to palpation   Brings on pain   No nodes or masses detected Respiratory:  clear to auscultation bilaterally, normal work  of breathing GI: soft, nontender, nondistended, + BS  No hepatomegaly  MS: no deformity Moving all extremities   Skin: warm and dry, no rash Neuro:  Strength and sensation are intact Psych: euthymic mood, full affect   EKG:  EKG is ordered today.  NSR 71 bpm  Possible LAA   Lipid Panel    Component Value Date/Time   CHOL 169 08/27/2020 0854   TRIG 146 08/27/2020 0854   HDL 57 08/27/2020 0854   CHOLHDL 3.4 04/29/2017 0956   LDLCALC 87 08/27/2020 0854   LDLCALC 128 (H) 04/29/2017 0956      Wt Readings from Last 3 Encounters:  09/13/20 193 lb (87.5 kg)  09/02/20 191 lb (86.6 kg)  08/21/20 191 lb (86.6 kg)      ASSESSMENT AND PLAN:  1  Chest pain  Atypical  I do not think cardiac   Brought on by palpation of L chest   I think pt may have injured picking up granddaughter who is over 40 lbs  She jumps often pt says  Follow   COnsider referral to sports med if persists since pt is s/p mastectomy  2  Dyspnea   Will set up for echo to eval systolic and diastolic function    3  Obesity  Discussed   Diet   Cut back on sugars  TRE  F/U based on test rsults    Current medicines are reviewed at length with the patient today.  The patient does not have concerns regarding medicines.  Signed, Dorris Carnes, MD  09/13/2020 11:47 AM    Kathryn Skinner [place], Kathryn Skinner, Kathryn Skinner  [id number] Phone: [phone]; Fax: [phone]

## 2020-10-03 ENCOUNTER — Ambulatory Visit: Payer: Medicaid Other | Admitting: Nurse Practitioner

## 2020-10-04 ENCOUNTER — Other Ambulatory Visit (HOSPITAL_COMMUNITY): Payer: Medicaid Other

## 2020-10-14 ENCOUNTER — Other Ambulatory Visit: Payer: Self-pay

## 2020-10-14 ENCOUNTER — Ambulatory Visit (HOSPITAL_COMMUNITY)
Admission: RE | Admit: 2020-10-14 | Discharge: 2020-10-14 | Disposition: A | Discharge status: Home / Self Care | Payer: Medicaid Other | Source: Ambulatory Visit | Attending: Internal Medicine | Admitting: Internal Medicine

## 2020-10-14 DIAGNOSIS — Z9013 Acquired absence of bilateral breasts and nipples: Secondary | ICD-10-CM | POA: Insufficient documentation

## 2020-10-14 DIAGNOSIS — Z923 Personal history of irradiation: Secondary | ICD-10-CM | POA: Diagnosis not present

## 2020-10-14 DIAGNOSIS — E669 Obesity, unspecified: Secondary | ICD-10-CM | POA: Diagnosis not present

## 2020-10-14 DIAGNOSIS — R0602 Shortness of breath: Secondary | ICD-10-CM | POA: Diagnosis not present

## 2020-10-14 DIAGNOSIS — Z9221 Personal history of antineoplastic chemotherapy: Secondary | ICD-10-CM | POA: Insufficient documentation

## 2020-10-14 DIAGNOSIS — E785 Hyperlipidemia, unspecified: Secondary | ICD-10-CM | POA: Diagnosis not present

## 2020-10-14 DIAGNOSIS — Z853 Personal history of malignant neoplasm of breast: Secondary | ICD-10-CM | POA: Diagnosis not present

## 2020-10-14 LAB — ECHOCARDIOGRAM COMPLETE
Area-P 1/2: 8.72 cm2
S' Lateral: 2.32 cm

## 2020-10-14 NOTE — Progress Notes (Signed)
  Echocardiogram 2D Echocardiogram has been performed.  Matilde Bash 10/14/2020, 11:08 AM

## 2020-10-15 ENCOUNTER — Telehealth: Payer: Self-pay

## 2020-10-15 NOTE — Telephone Encounter (Signed)
-----   Message from Dorris Carnes V, MD sent at 10/14/2020 11:35 PM EDT ----- Echo is normal  Normal pumping function and valve function I am not convinced her symptoms represent angina

## 2020-10-15 NOTE — Telephone Encounter (Signed)
Patient verbalized understanding of result note and had no questions or concerns at this time.

## 2020-11-08 DIAGNOSIS — Z9189 Other specified personal risk factors, not elsewhere classified: Secondary | ICD-10-CM | POA: Diagnosis not present

## 2020-11-08 DIAGNOSIS — Z171 Estrogen receptor negative status [ER-]: Secondary | ICD-10-CM | POA: Diagnosis not present

## 2020-11-08 DIAGNOSIS — C50211 Malignant neoplasm of upper-inner quadrant of right female breast: Secondary | ICD-10-CM | POA: Diagnosis not present

## 2020-11-20 ENCOUNTER — Ambulatory Visit: Payer: Medicaid Other | Admitting: Plastic Surgery

## 2020-11-21 ENCOUNTER — Ambulatory Visit: Payer: Medicaid Other | Admitting: Plastic Surgery

## 2021-01-27 ENCOUNTER — Telehealth: Payer: Self-pay | Admitting: Nurse Practitioner

## 2021-01-27 NOTE — Telephone Encounter (Signed)
..  Patient declines further follow up and engagement by the Managed Medicaid Team. Appropriate care team members and provider have been notified via electronic communication. The Managed Medicaid Team is available to follow up with the patient after provider conversation with the patient regarding recommendation for engagement and subsequent re-referral to the Managed Medicaid Team.    Jennifer Alley Care Guide, High Risk Medicaid Managed Care Embedded Care Coordination   [facility]   

## 2021-02-26 ENCOUNTER — Other Ambulatory Visit: Payer: Self-pay

## 2021-02-26 ENCOUNTER — Telehealth: Payer: Self-pay | Admitting: Nurse Practitioner

## 2021-02-26 MED ORDER — LEVOTHYROXINE SODIUM 88 MCG PO TABS: 88.0000 ug | ORAL_TABLET | Freq: Every day | ORAL | 1 refills | Status: DC

## 2021-02-26 NOTE — Telephone Encounter (Signed)
Please send in levothyroxine (SYNTHROID) 88 MCG tablet

## 2021-02-26 NOTE — Telephone Encounter (Signed)
Rx sent 

## 2021-03-06 ENCOUNTER — Telehealth: Payer: Self-pay

## 2021-03-06 ENCOUNTER — Other Ambulatory Visit: Payer: Self-pay

## 2021-03-06 MED ORDER — ATORVASTATIN CALCIUM 10 MG PO TABS
ORAL_TABLET | ORAL | 3 refills | Status: DC
Start: 2021-03-06 — End: 2023-09-23

## 2021-03-06 NOTE — Telephone Encounter (Signed)
Patient called need med refill  atorvastatin (LIPITOR) 10 MG tablet   Pharmacy: [facility]

## 2021-03-06 NOTE — Telephone Encounter (Signed)
Rx sent 

## 2021-05-09 ENCOUNTER — Other Ambulatory Visit: Payer: Self-pay

## 2021-05-09 ENCOUNTER — Ambulatory Visit (INDEPENDENT_AMBULATORY_CARE_PROVIDER_SITE_OTHER): Payer: Medicaid Other

## 2021-05-09 DIAGNOSIS — Z23 Encounter for immunization: Secondary | ICD-10-CM

## 2021-06-02 ENCOUNTER — Other Ambulatory Visit: Payer: Self-pay

## 2021-06-02 ENCOUNTER — Encounter: Payer: Self-pay | Admitting: Adult Health

## 2021-06-02 ENCOUNTER — Ambulatory Visit (INDEPENDENT_AMBULATORY_CARE_PROVIDER_SITE_OTHER): Payer: Medicaid Other | Admitting: Adult Health

## 2021-06-02 VITALS — BP 144/77 | HR 87 | Ht 66.0 in | Wt 194.0 lb

## 2021-06-02 DIAGNOSIS — Z01419 Encounter for gynecological examination (general) (routine) without abnormal findings: Secondary | ICD-10-CM | POA: Diagnosis not present

## 2021-06-02 DIAGNOSIS — Z853 Personal history of malignant neoplasm of breast: Secondary | ICD-10-CM

## 2021-06-02 DIAGNOSIS — Z1211 Encounter for screening for malignant neoplasm of colon: Secondary | ICD-10-CM | POA: Insufficient documentation

## 2021-06-02 DIAGNOSIS — Z780 Asymptomatic menopausal state: Secondary | ICD-10-CM | POA: Insufficient documentation

## 2021-06-02 HISTORY — DX: Encounter for gynecological examination (general) (routine) without abnormal findings: Z01.419

## 2021-06-02 LAB — HEMOCCULT GUIAC POC 1CARD (OFFICE): Fecal Occult Blood, POC: NEGATIVE

## 2021-06-02 NOTE — Progress Notes (Signed)
Patient ID: Kathryn Skinner, female   DOB: Jan 10, 1971, 50 y.o.   MRN: [id number] History of Present Illness: Kathryn Skinner is a 50 year old white female with 9 year DP,PM in for well woman gyn exam. She had bilateral mastectomy due to breast cancer and is thinking about reconstruction now.  Lab Results  Component Value Date   DIAGPAP  03/30/2019    - Negative for Intraepithelial Lesions or Malignancy (NILM)   DIAGPAP - Benign reactive/reparative changes 03/30/2019   [place] Negative 03/30/2019    PCP is Jonetta Osgood NP.  Current Medications, Allergies, Past Medical History, Past Surgical History, Family History and Social History were reviewed in [employer] record.     Review of Systems:  Patient denies any headaches, hearing loss, fatigue, blurred vision, shortness of breath, chest pain, abdominal pain, problems with bowel movements, urination, or intercourse. No joint pain or mood swings.  She denies any vaginal bleeding.  Physical Exam:BP (!) 144/77 (BP Location: Left Arm, Patient Position: Sitting, Cuff Size: Normal)   Pulse 87   Ht 5\' 6"  (1.676 m)   Wt 194 lb (88 kg)   LMP  (LMP Unknown)   BMI 31.31 kg/m   General:  Well developed, well nourished, no acute distress Skin:  Warm and dry Neck:  Midline trachea, normal thyroid, good ROM, no lymphadenopathy Lungs; Clear to auscultation bilaterally Breast: surgically absent, sp bilateral mastectomy for breast cancer 2016, no masses or tenderness chest wall. Cardiovascular: Regular rate and rhythm Abdomen:  Soft, non tender, no hepatosplenomegaly Pelvic:  External genitalia is normal in appearance, no lesions.  The vagina is pale with loss of rugae. Urethra has no lesions or masses. The cervix is bulbous.  Uterus is felt to be normal size, shape, and contour.  No adnexal masses or tenderness noted.Bladder is non tender, no masses felt. Rectal: Good sphincter tone, no polyps, or hemorrhoids felt.  Hemoccult  negative. Extremities/musculoskeletal:  No swelling or varicosities noted, no clubbing or cyanosis Psych:  No mood changes, alert and cooperative,seems happy AA refused Fall risk is low Depression screen [facility] 2/9 06/02/2021 09/02/2020 08/21/2020  Decreased Interest 0 0 0  Down, Depressed, Hopeless 0 0 0  PHQ - 2 Score 0 0 0  Altered sleeping 0 - -  Tired, decreased energy 1 - -  Change in appetite 0 - -  Feeling bad or failure about yourself  0 - -  Trouble concentrating 0 - -  Moving slowly or fidgety/restless 0 - -  Suicidal thoughts 0 - -  PHQ-9 Score 1 - -    GAD 7 : Generalized Anxiety Score 06/02/2021  Nervous, Anxious, on Edge 1  Control/stop worrying 0  Worry too much - different things 0  Trouble relaxing 0  Restless 0  Easily annoyed or irritable 1  Afraid - awful might happen 0  Total GAD 7 Score 2      Upstream - 06/02/21 1335       Pregnancy Intention Screening   Does the patient want to become pregnant in the next year? No    Does the patient's partner want to become pregnant in the next year? No    Would the patient like to discuss contraceptive options today? No      Contraception Wrap Up   Current Method No Method - Other Reason   PM   End Method No Method - Other Reason   PM   Contraception Counseling Provided No  Examination chaperoned by Marcelino Scot RN  Impression and Plan:  1. Encounter for well woman exam with routine gynecological exam Pap and physical in 1 year Labs with PCP Colonoscopy per GI  2. Encounter for screening fecal occult blood testing   3. History of breast cancer   4. Postmenopause

## 2021-06-06 DIAGNOSIS — M79672 Pain in left foot: Secondary | ICD-10-CM | POA: Diagnosis not present

## 2021-06-06 DIAGNOSIS — H60502 Unspecified acute noninfective otitis externa, left ear: Secondary | ICD-10-CM | POA: Diagnosis not present

## 2021-06-06 DIAGNOSIS — J069 Acute upper respiratory infection, unspecified: Secondary | ICD-10-CM | POA: Diagnosis not present

## 2021-06-06 DIAGNOSIS — J029 Acute pharyngitis, unspecified: Secondary | ICD-10-CM | POA: Diagnosis not present

## 2021-08-21 DIAGNOSIS — G43109 Migraine with aura, not intractable, without status migrainosus: Secondary | ICD-10-CM | POA: Diagnosis not present

## 2021-08-21 DIAGNOSIS — E7849 Other hyperlipidemia: Secondary | ICD-10-CM | POA: Diagnosis not present

## 2021-08-21 DIAGNOSIS — E6609 Other obesity due to excess calories: Secondary | ICD-10-CM | POA: Diagnosis not present

## 2021-08-21 DIAGNOSIS — E039 Hypothyroidism, unspecified: Secondary | ICD-10-CM | POA: Diagnosis not present

## 2021-08-21 DIAGNOSIS — Z853 Personal history of malignant neoplasm of breast: Secondary | ICD-10-CM | POA: Diagnosis not present

## 2021-08-21 DIAGNOSIS — Z9013 Acquired absence of bilateral breasts and nipples: Secondary | ICD-10-CM | POA: Diagnosis not present

## 2021-08-21 DIAGNOSIS — L209 Atopic dermatitis, unspecified: Secondary | ICD-10-CM | POA: Diagnosis not present

## 2021-08-21 DIAGNOSIS — E785 Hyperlipidemia, unspecified: Secondary | ICD-10-CM | POA: Diagnosis not present

## 2021-08-21 DIAGNOSIS — E559 Vitamin D deficiency, unspecified: Secondary | ICD-10-CM | POA: Diagnosis not present

## 2021-08-21 DIAGNOSIS — Z0001 Encounter for general adult medical examination with abnormal findings: Secondary | ICD-10-CM | POA: Diagnosis not present

## 2021-09-19 DIAGNOSIS — Z131 Encounter for screening for diabetes mellitus: Secondary | ICD-10-CM | POA: Diagnosis not present

## 2021-09-19 DIAGNOSIS — E7849 Other hyperlipidemia: Secondary | ICD-10-CM | POA: Diagnosis not present

## 2021-09-19 DIAGNOSIS — E039 Hypothyroidism, unspecified: Secondary | ICD-10-CM | POA: Diagnosis not present

## 2021-09-19 DIAGNOSIS — E6609 Other obesity due to excess calories: Secondary | ICD-10-CM | POA: Diagnosis not present

## 2021-09-19 DIAGNOSIS — E559 Vitamin D deficiency, unspecified: Secondary | ICD-10-CM | POA: Diagnosis not present

## 2021-09-23 DIAGNOSIS — Z6830 Body mass index (BMI) 30.0-30.9, adult: Secondary | ICD-10-CM | POA: Diagnosis not present

## 2021-09-23 DIAGNOSIS — E6609 Other obesity due to excess calories: Secondary | ICD-10-CM | POA: Diagnosis not present

## 2021-09-23 DIAGNOSIS — Z171 Estrogen receptor negative status [ER-]: Secondary | ICD-10-CM | POA: Diagnosis not present

## 2021-09-23 DIAGNOSIS — C50211 Malignant neoplasm of upper-inner quadrant of right female breast: Secondary | ICD-10-CM | POA: Diagnosis not present

## 2021-09-23 DIAGNOSIS — Z9189 Other specified personal risk factors, not elsewhere classified: Secondary | ICD-10-CM | POA: Diagnosis not present

## 2021-09-25 DIAGNOSIS — E6609 Other obesity due to excess calories: Secondary | ICD-10-CM | POA: Diagnosis not present

## 2021-09-25 DIAGNOSIS — E7849 Other hyperlipidemia: Secondary | ICD-10-CM | POA: Diagnosis not present

## 2021-09-25 DIAGNOSIS — L209 Atopic dermatitis, unspecified: Secondary | ICD-10-CM | POA: Diagnosis not present

## 2021-09-25 DIAGNOSIS — E039 Hypothyroidism, unspecified: Secondary | ICD-10-CM | POA: Diagnosis not present

## 2021-09-25 DIAGNOSIS — R7303 Prediabetes: Secondary | ICD-10-CM | POA: Diagnosis not present

## 2021-09-25 DIAGNOSIS — E559 Vitamin D deficiency, unspecified: Secondary | ICD-10-CM | POA: Diagnosis not present

## 2021-12-29 DIAGNOSIS — E785 Hyperlipidemia, unspecified: Secondary | ICD-10-CM | POA: Diagnosis not present

## 2021-12-29 DIAGNOSIS — E039 Hypothyroidism, unspecified: Secondary | ICD-10-CM | POA: Diagnosis not present

## 2021-12-29 DIAGNOSIS — E6609 Other obesity due to excess calories: Secondary | ICD-10-CM | POA: Diagnosis not present

## 2021-12-29 DIAGNOSIS — R7303 Prediabetes: Secondary | ICD-10-CM | POA: Diagnosis not present

## 2022-01-01 DIAGNOSIS — L209 Atopic dermatitis, unspecified: Secondary | ICD-10-CM | POA: Diagnosis not present

## 2022-01-01 DIAGNOSIS — E039 Hypothyroidism, unspecified: Secondary | ICD-10-CM | POA: Diagnosis not present

## 2022-01-01 DIAGNOSIS — R7303 Prediabetes: Secondary | ICD-10-CM | POA: Diagnosis not present

## 2022-01-01 DIAGNOSIS — Z853 Personal history of malignant neoplasm of breast: Secondary | ICD-10-CM | POA: Diagnosis not present

## 2022-01-01 DIAGNOSIS — H3552 Pigmentary retinal dystrophy: Secondary | ICD-10-CM | POA: Diagnosis not present

## 2022-01-01 DIAGNOSIS — E6609 Other obesity due to excess calories: Secondary | ICD-10-CM | POA: Diagnosis not present

## 2022-01-01 DIAGNOSIS — Z79899 Other long term (current) drug therapy: Secondary | ICD-10-CM | POA: Diagnosis not present

## 2022-01-01 DIAGNOSIS — E785 Hyperlipidemia, unspecified: Secondary | ICD-10-CM | POA: Diagnosis not present

## 2022-04-16 DIAGNOSIS — R7303 Prediabetes: Secondary | ICD-10-CM | POA: Diagnosis not present

## 2022-04-16 DIAGNOSIS — E039 Hypothyroidism, unspecified: Secondary | ICD-10-CM | POA: Diagnosis not present

## 2022-04-16 DIAGNOSIS — Z79899 Other long term (current) drug therapy: Secondary | ICD-10-CM | POA: Diagnosis not present

## 2022-04-16 DIAGNOSIS — E785 Hyperlipidemia, unspecified: Secondary | ICD-10-CM | POA: Diagnosis not present

## 2022-04-27 DIAGNOSIS — E7849 Other hyperlipidemia: Secondary | ICD-10-CM | POA: Diagnosis not present

## 2022-04-27 DIAGNOSIS — E038 Other specified hypothyroidism: Secondary | ICD-10-CM | POA: Diagnosis not present

## 2022-06-10 ENCOUNTER — Ambulatory Visit (INDEPENDENT_AMBULATORY_CARE_PROVIDER_SITE_OTHER): Payer: Medicaid Other | Admitting: Adult Health

## 2022-06-10 ENCOUNTER — Other Ambulatory Visit (HOSPITAL_COMMUNITY)
Admission: RE | Admit: 2022-06-10 | Discharge: 2022-06-10 | Disposition: A | Discharge status: Home / Self Care | Payer: Medicaid Other | Source: Ambulatory Visit | Attending: Adult Health | Admitting: Adult Health

## 2022-06-10 ENCOUNTER — Encounter: Payer: Self-pay | Admitting: Adult Health

## 2022-06-10 VITALS — BP 134/88 | HR 81 | Ht 65.0 in | Wt 184.5 lb

## 2022-06-10 DIAGNOSIS — Z853 Personal history of malignant neoplasm of breast: Secondary | ICD-10-CM | POA: Diagnosis not present

## 2022-06-10 DIAGNOSIS — Z01419 Encounter for gynecological examination (general) (routine) without abnormal findings: Secondary | ICD-10-CM

## 2022-06-10 DIAGNOSIS — Z1211 Encounter for screening for malignant neoplasm of colon: Secondary | ICD-10-CM

## 2022-06-10 DIAGNOSIS — Z0000 Encounter for general adult medical examination without abnormal findings: Secondary | ICD-10-CM | POA: Insufficient documentation

## 2022-06-10 LAB — HEMOCCULT GUIAC POC 1CARD (OFFICE): Fecal Occult Blood, POC: NEGATIVE

## 2022-06-10 NOTE — Progress Notes (Signed)
Patient ID: Kathryn Skinner, female   DOB: 1971/01/11, 51 y.o.   MRN: [id number] History of Present Illness: Kathryn Skinner is a 51 year old white female, with DP, PM in for a well woman gyn exam and pap.  PCP is [facility]   Current Medications, Allergies, Past Medical History, Past Surgical History, Family History and Social History were reviewed in [employer] record.     Review of Systems: Patient denies any headaches, hearing loss, fatigue, blurred vision, shortness of breath, chest pain, abdominal pain, problems with bowel movements, urination, or intercourse. No joint pain or mood swings.  She denies any vaginal bleeding    Physical Exam:BP 134/88 (BP Location: Left Arm, Patient Position: Sitting, Cuff Size: Normal)   Pulse 81   Ht '5\' 5"'$  (1.651 m)   Wt 184 lb 8 oz (83.7 kg)   LMP  (LMP Unknown)   BMI 30.70 kg/m   General:  Well developed, well nourished, no acute distress Skin:  Warm and dry Neck:  Midline trachea, normal thyroid, good ROM, no lymphadenopathy Lungs; Clear to auscultation bilaterally Breast: breasts surgically absent sp bilateral mastectomy, no masses of chest wall felt Cardiovascular: Regular rate and rhythm Abdomen:  Soft, non tender, no hepatosplenomegaly Pelvic:  External genitalia is normal in appearance, no lesions.  The vagina is normal in appearance. Urethra has no lesions or masses. The cervix is bulbous, and smooth, pap with HR HPV genotyping performed.  Uterus is felt to be normal size, shape, and contour.  No adnexal masses or tenderness noted.Bladder is non tender, no masses felt. Rectal: Good sphincter tone, no polyps, or hemorrhoids felt.  Hemoccult negative. Extremities/musculoskeletal:  No swelling or varicosities noted, no clubbing or cyanosis Psych:  No mood changes, alert and cooperative,seems happy AA is 0 Fall risk is low    06/10/2022   10:51 AM 06/02/2021    1:36 PM 09/02/2020    9:43 AM  Depression screen PHQ  2/9  Decreased Interest 0 0 0  Down, Depressed, Hopeless 0 0 0  PHQ - 2 Score 0 0 0  Altered sleeping 0 0   Tired, decreased energy 1 1   Change in appetite 0 0   Feeling bad or failure about yourself  0 0   Trouble concentrating 0 0   Moving slowly or fidgety/restless 0 0   Suicidal thoughts 0 0   PHQ-9 Score 1 1        06/10/2022   10:52 AM 06/02/2021    1:38 PM  GAD 7 : Generalized Anxiety Score  Nervous, Anxious, on Edge 0 1  Control/stop worrying 0 0  Worry too much - different things 0 0  Trouble relaxing 0 0  Restless 0 0  Easily annoyed or irritable 1 1  Afraid - awful might happen 0 0  Total GAD 7 Score 1 2      Upstream - 06/10/22 1048       Pregnancy Intention Screening   Does the patient want to become pregnant in the next year? N/A    Does the patient's partner want to become pregnant in the next year? N/A    Would the patient like to discuss contraceptive options today? N/A      Contraception Wrap Up   Current Method No Method - Other Reason   postmenopausal   Reason for No Current Contraceptive Method at Intake ([facility] Only) Other    End Method No Method - Other Reason   postmenopausal  Contraception Counseling Provided No            Examination chaperoned by Levy Pupa LPN   Impression and Plan: 1. Routine general medical examination at a health care facility Had pap and physical today Get flu shot, can get at  drug store  2. Encounter for gynecological examination with Papanicolaou smear of cervix Pap sent Physical with PCP next year  Next year Pap in 3 years if normal Labs with PCP Colonoscopy per GI  3. History of breast cancer  4. Encounter for screening fecal occult blood testing Hemoccult was negative

## 2022-06-16 LAB — CYTOLOGY - PAP
Adequacy: ABSENT
Comment: NEGATIVE
Diagnosis: NEGATIVE
High risk HPV: NEGATIVE

## 2022-10-07 DIAGNOSIS — E7849 Other hyperlipidemia: Secondary | ICD-10-CM | POA: Diagnosis not present

## 2022-10-07 DIAGNOSIS — M5431 Sciatica, right side: Secondary | ICD-10-CM | POA: Diagnosis not present

## 2022-10-07 DIAGNOSIS — E038 Other specified hypothyroidism: Secondary | ICD-10-CM | POA: Diagnosis not present

## 2022-10-07 DIAGNOSIS — Z0001 Encounter for general adult medical examination with abnormal findings: Secondary | ICD-10-CM | POA: Diagnosis not present

## 2022-12-02 DIAGNOSIS — Z131 Encounter for screening for diabetes mellitus: Secondary | ICD-10-CM | POA: Diagnosis not present

## 2022-12-02 DIAGNOSIS — E038 Other specified hypothyroidism: Secondary | ICD-10-CM | POA: Diagnosis not present

## 2022-12-02 DIAGNOSIS — E7849 Other hyperlipidemia: Secondary | ICD-10-CM | POA: Diagnosis not present

## 2022-12-02 DIAGNOSIS — Z0001 Encounter for general adult medical examination with abnormal findings: Secondary | ICD-10-CM | POA: Diagnosis not present

## 2022-12-07 DIAGNOSIS — E7849 Other hyperlipidemia: Secondary | ICD-10-CM | POA: Diagnosis not present

## 2022-12-07 DIAGNOSIS — R7303 Prediabetes: Secondary | ICD-10-CM | POA: Diagnosis not present

## 2022-12-07 DIAGNOSIS — M5431 Sciatica, right side: Secondary | ICD-10-CM | POA: Diagnosis not present

## 2022-12-07 DIAGNOSIS — E038 Other specified hypothyroidism: Secondary | ICD-10-CM | POA: Diagnosis not present

## 2023-09-03 ENCOUNTER — Ambulatory Visit: Admitting: Family Medicine

## 2023-09-16 ENCOUNTER — Encounter: Payer: Self-pay | Admitting: Family Medicine

## 2023-09-16 ENCOUNTER — Ambulatory Visit (INDEPENDENT_AMBULATORY_CARE_PROVIDER_SITE_OTHER): Admitting: Family Medicine

## 2023-09-16 VITALS — BP 118/62 | HR 93 | Temp 98.1°F | Ht 65.0 in | Wt 187.0 lb

## 2023-09-16 DIAGNOSIS — H3552 Pigmentary retinal dystrophy: Secondary | ICD-10-CM | POA: Diagnosis not present

## 2023-09-16 DIAGNOSIS — Z9013 Acquired absence of bilateral breasts and nipples: Secondary | ICD-10-CM

## 2023-09-16 DIAGNOSIS — Z853 Personal history of malignant neoplasm of breast: Secondary | ICD-10-CM | POA: Diagnosis not present

## 2023-09-16 DIAGNOSIS — E7800 Pure hypercholesterolemia, unspecified: Secondary | ICD-10-CM

## 2023-09-16 DIAGNOSIS — E039 Hypothyroidism, unspecified: Secondary | ICD-10-CM

## 2023-09-16 DIAGNOSIS — Z7689 Persons encountering health services in other specified circumstances: Secondary | ICD-10-CM

## 2023-09-16 MED ORDER — MELOXICAM 15 MG PO TABS
15.0000 mg | ORAL_TABLET | Freq: Every day | ORAL | 2 refills | Status: DC
Start: 2023-09-16 — End: 2023-09-16

## 2023-09-16 MED ORDER — MELOXICAM 15 MG PO TABS: 15.0000 mg | ORAL_TABLET | Freq: Every day | ORAL | 2 refills | Status: AC

## 2023-09-16 NOTE — Progress Notes (Signed)
 Subjective:    Patient ID: Kathryn Skinner, female    DOB: 05/20/1971, 53 y.o.   MRN: [id number]  HPI  Patient is a 53 year old Caucasian female here today to establish care.  She states that approximately 7 years ago she was diagnosed with breast cancer.  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  She underwent a double mastectomy.  The cancer spread to 1 lymph node.  Therefore she went through radiation treatments as well as chemotherapy.  She follows up regularly with her oncologist.  She also sees a gynecologist who performs her Pap smears.  Her last Pap smear was 2 years ago.  She had a colonoscopy in 2017.  They recommended a repeat colonoscopy in 2027.  She is due for the shingles vaccine.  She is also due for the pneumonia vaccine.  She declines these today.  Past medical history is also significant for hyperlipidemia as well as hypothyroidism.  She is due to check fasting lab work to monitor this.  Occasionally she has right shoulder pain.  Hips and when she sleeps.  Occasionally will hurt to raise her arm over her head.  Some days it does not bother her at all. 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   Thyroid disease    hypothyroid   Past Surgical History:  Procedure Laterality Date   BREAST SURGERY     CHOLECYSTECTOMY     COLONOSCOPY N/A 11/27/2015   Procedure: COLONOSCOPY;  Surgeon: Iva Boop, MD;  [facility] ENDOSCOPY;  Service: Endoscopy;  Laterality: N/A;   MASTECTOMY Bilateral 2017   PORT-A-CATH REMOVAL     PORTACATH PLACEMENT     Current Outpatient Medications on File Prior to  Visit  Medication Sig Dispense Refill   atorvastatin (LIPITOR) 10 MG tablet TK 1 T PO HS FOR HIGH CHOLESTEROL 30 tablet 3   levothyroxine (SYNTHROID) 88 MCG tablet Take 1 tablet (88 mcg total) by mouth daily before breakfast. 90 tablet 1   No current facility-administered medications on file prior to visit.   Allergies  Allergen Reactions   Sulfamethoxazole-Trimethoprim Rash   Social History   Socioeconomic History   Marital status: Media planner    Spouse name: Not on file   Number of children: 1   Years of education: 12   Highest education level: Not on file  Occupational History   Occupation: disabled    Comment: vision impairment  Tobacco Use   Smoking status: Never   Smokeless tobacco: Never   Tobacco comments:    N/I  Vaping Use   Vaping status: Never Used  Substance and Sexual Activity   Alcohol use: No   Drug use: No   Sexual activity: Yes    Birth control/protection: Post-menopausal  Other Topics Concern   Not on file  Social History Narrative   Lives at home BF   Vision impaired since age 74, has eye disease that disables from work   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17,  daughter, with new baby 2018   Social Drivers of Health   Financial Resource Strain: Low Risk  (06/10/2022)   Overall Financial Resource Strain (CARDIA)    Difficulty of Paying Living Expenses: Not hard at all  Food Insecurity: No Food Insecurity (06/10/2022)   Hunger Vital Sign    Worried About Running Out of Food in the Last Year: Never true    Ran Out of Food in the Last Year: Never true  Transportation Needs: No Transportation Needs (06/10/2022)   PRAPARE - Administrator, Civil Service (Medical): No    Lack of Transportation (Non-Medical): No  Physical Activity: Inactive (06/10/2022)   Exercise Vital Sign    Days of Exercise per Week: 0 days    Minutes of Exercise per Session: 0 min  Stress: No Stress Concern Present (06/10/2022)   [employer] of Occupational  Health - Occupational Stress Questionnaire    Feeling of Stress : Only a little  Social Connections: Socially Isolated (06/10/2022)   Social Connection and Isolation Panel [NHANES]    Frequency of Communication with Friends and Family: More than three times a week    Frequency of Social Gatherings with Friends and Family: Once a week    Attends Religious Services: Never    Database administrator or Organizations: No    Attends Banker Meetings: Never    Marital Status: Divorced  Catering manager Violence: Not At Risk (06/10/2022)   Humiliation, Afraid, Rape, and Kick questionnaire    Fear of Current or Ex-Partner: No    Emotionally Abused: No    Physically Abused: No    Sexually Abused: No   Family History  Problem Relation Age of Onset   Thyroid disease Maternal Grandmother    Emphysema Maternal Grandmother    Cancer Maternal Grandmother        lung   Alcohol abuse Maternal Grandfather    Cancer Maternal Grandfather        pancreatic   Thyroid disease Mother    Thyroid disease Sister    Asthma Sister    Liver disease Maternal Aunt    Breast cancer Maternal Uncle    Other Maternal Uncle        eye disease     Review of Systems     Objective:   Physical Exam Vitals reviewed.  Constitutional:      General: She is not in acute distress.    Appearance: Normal appearance. She is not ill-appearing or toxic-appearing.  Cardiovascular:     Rate and Rhythm: Normal rate and regular rhythm.     Heart sounds: Normal heart sounds. No murmur heard.    No friction rub. No gallop.  Pulmonary:     Effort: Pulmonary effort is normal. No respiratory distress.     Breath sounds: Normal breath sounds. No wheezing, rhonchi or rales.  Abdominal:     General: Bowel sounds are normal. There is no distension.     Tenderness: There is no abdominal tenderness. There is no guarding or rebound.  Musculoskeletal:     Right lower leg: No edema.     Left lower leg: No edema.   Neurological:     Mental Status: She is alert.           Assessment & Plan:   Pure hypercholesterolemia - Plan: CBC with Differential/Platelet, COMPLETE METABOLIC PANEL WITHOUT GFR, Lipid panel, TSH  Hypothyroidism, unspecified type - Plan: TSH  Retinitis pigmentosa  S/P mastectomy, bilateral  History of breast cancer  Encounter to establish care Patient also has a history of retinitis pigmentosa.  She states that she is unable to drive.  She does not have peripheral vision but she still has good central vision.  Her blood pressure today is well-controlled.  She is due to check a CBC a CMP and a lipid panel to monitor her cholesterol at her earliest convenience patient RELATED.  Will make changes in her levothyroxine or atorvastatin based on this lab work.  Her colonoscopy is up-to-date until 2027 and her Pap smear is due next year.  I recommended the shingles vaccine and the pneumonia shot but she defers these today.  I recommended meloxicam 15 mg daily as needed for shoulder pain

## 2023-09-21 ENCOUNTER — Other Ambulatory Visit

## 2023-09-21 DIAGNOSIS — E039 Hypothyroidism, unspecified: Secondary | ICD-10-CM | POA: Diagnosis not present

## 2023-09-21 DIAGNOSIS — E7800 Pure hypercholesterolemia, unspecified: Secondary | ICD-10-CM | POA: Diagnosis not present

## 2023-09-22 LAB — COMPLETE METABOLIC PANEL WITHOUT GFR
AG Ratio: 1.2 (calc) (ref 1.0–2.5)
ALT: 16 U/L (ref 6–29)
AST: 16 U/L (ref 10–35)
Albumin: 4 g/dL (ref 3.6–5.1)
Alkaline phosphatase (APISO): 107 U/L (ref 37–153)
BUN: 20 mg/dL (ref 7–25)
CO2: 23 mmol/L (ref 20–32)
Calcium: 9.1 mg/dL (ref 8.6–10.4)
Chloride: 109 mmol/L (ref 98–110)
Creat: 0.92 mg/dL (ref 0.50–1.03)
Globulin: 3.3 g/dL (ref 1.9–3.7)
Glucose, Bld: 87 mg/dL (ref 65–99)
Potassium: 5.2 mmol/L (ref 3.5–5.3)
Sodium: 144 mmol/L (ref 135–146)
Total Bilirubin: 0.3 mg/dL (ref 0.2–1.2)
Total Protein: 7.3 g/dL (ref 6.1–8.1)

## 2023-09-22 LAB — CBC WITH DIFFERENTIAL/PLATELET
Absolute Lymphocytes: 1968 {cells}/uL (ref 850–3900)
Absolute Monocytes: 844 {cells}/uL (ref 200–950)
Basophils Absolute: 53 {cells}/uL (ref 0–200)
Basophils Relative: 0.7 %
Eosinophils Absolute: 289 {cells}/uL (ref 15–500)
Eosinophils Relative: 3.8 %
HCT: 38.7 % (ref 35.0–45.0)
Hemoglobin: 12.5 g/dL (ref 11.7–15.5)
MCH: 28.6 pg (ref 27.0–33.0)
MCHC: 32.3 g/dL (ref 32.0–36.0)
MCV: 88.6 fL (ref 80.0–100.0)
MPV: 9.9 fL (ref 7.5–12.5)
Monocytes Relative: 11.1 %
Neutro Abs: 4446 {cells}/uL (ref 1500–7800)
Neutrophils Relative %: 58.5 %
Platelets: 254 10*3/uL (ref 140–400)
RBC: 4.37 10*6/uL (ref 3.80–5.10)
RDW: 12.8 % (ref 11.0–15.0)
Total Lymphocyte: 25.9 %
WBC: 7.6 10*3/uL (ref 3.8–10.8)

## 2023-09-22 LAB — LIPID PANEL
Cholesterol: 234 mg/dL — ABNORMAL HIGH (ref <200)
HDL: 71 mg/dL (ref >=50)
LDL Cholesterol (Calc): 141 mg/dL — ABNORMAL HIGH
Non-HDL Cholesterol (Calc): 163 mg/dL — ABNORMAL HIGH (ref <130)
Total CHOL/HDL Ratio: 3.3 (calc) (ref <5.0)
Triglycerides: 108 mg/dL (ref <150)

## 2023-09-22 LAB — TSH: TSH: 6.74 m[IU]/L — ABNORMAL HIGH

## 2023-09-23 ENCOUNTER — Other Ambulatory Visit: Payer: Self-pay

## 2023-09-23 ENCOUNTER — Telehealth: Payer: Self-pay

## 2023-09-23 MED ORDER — LEVOTHYROXINE SODIUM 100 MCG PO TABS: 100.0000 ug | ORAL_TABLET | Freq: Every day | ORAL | 3 refills | Status: AC

## 2023-09-23 MED ORDER — ATORVASTATIN CALCIUM 20 MG PO TABS: 20.0000 mg | ORAL_TABLET | Freq: Every day | ORAL | 3 refills | Status: AC

## 2023-09-23 NOTE — Telephone Encounter (Signed)
 Copied from CRM [id number]. Topic: Clinical - Lab/Test Results >> Sep 23, 2023  8:30 AM Gery Pray wrote: Reason for CRM: Patient calling regarding labs taken on 04/01. Please contact patient back at [phone].

## 2023-09-23 NOTE — Telephone Encounter (Signed)
 Copied from CRM [id number]. Topic: Clinical - Lab/Test Results >> Sep 23, 2023  1:18 PM Almira Coaster wrote: Reason for CRM: Patient is calling to follow up on on her labs done on 09/21/2023, advised that we attempted to call her and went over the message Dr.Pickard left for her, she understood and had no questions but wanted to know when Dr.Pickard will send the updated medication.

## 2023-09-26 ENCOUNTER — Emergency Department (HOSPITAL_COMMUNITY)

## 2023-09-26 ENCOUNTER — Other Ambulatory Visit: Payer: Self-pay

## 2023-09-26 ENCOUNTER — Encounter (HOSPITAL_COMMUNITY): Payer: Self-pay | Admitting: *Deleted

## 2023-09-26 ENCOUNTER — Emergency Department (HOSPITAL_COMMUNITY)
Admission: EM | Admit: 2023-09-26 | Discharge: 2023-09-27 | Disposition: A | Discharge status: Home / Self Care | Attending: Emergency Medicine | Admitting: Emergency Medicine

## 2023-09-26 DIAGNOSIS — I10 Essential (primary) hypertension: Secondary | ICD-10-CM | POA: Insufficient documentation

## 2023-09-26 DIAGNOSIS — Z79899 Other long term (current) drug therapy: Secondary | ICD-10-CM | POA: Insufficient documentation

## 2023-09-26 DIAGNOSIS — K760 Fatty (change of) liver, not elsewhere classified: Secondary | ICD-10-CM | POA: Diagnosis not present

## 2023-09-26 DIAGNOSIS — Z853 Personal history of malignant neoplasm of breast: Secondary | ICD-10-CM | POA: Diagnosis not present

## 2023-09-26 DIAGNOSIS — R0789 Other chest pain: Secondary | ICD-10-CM | POA: Insufficient documentation

## 2023-09-26 DIAGNOSIS — R0602 Shortness of breath: Secondary | ICD-10-CM | POA: Insufficient documentation

## 2023-09-26 DIAGNOSIS — R42 Dizziness and giddiness: Secondary | ICD-10-CM | POA: Diagnosis not present

## 2023-09-26 DIAGNOSIS — J9811 Atelectasis: Secondary | ICD-10-CM | POA: Diagnosis not present

## 2023-09-26 DIAGNOSIS — R7989 Other specified abnormal findings of blood chemistry: Secondary | ICD-10-CM | POA: Diagnosis not present

## 2023-09-26 DIAGNOSIS — R079 Chest pain, unspecified: Secondary | ICD-10-CM | POA: Diagnosis not present

## 2023-09-26 LAB — BASIC METABOLIC PANEL WITH GFR
Anion gap: 12 (ref 5–15)
BUN: 21 mg/dL — ABNORMAL HIGH (ref 6–20)
CO2: 22 mmol/L (ref 22–32)
Calcium: 9.2 mg/dL (ref 8.9–10.3)
Chloride: 106 mmol/L (ref 98–111)
Creatinine, Ser: 0.85 mg/dL (ref 0.44–1.00)
GFR, Estimated: >60 mL/min (ref >60)
Glucose, Bld: 102 mg/dL — ABNORMAL HIGH (ref 70–99)
Potassium: 3.9 mmol/L (ref 3.5–5.1)
Sodium: 140 mmol/L (ref 135–145)

## 2023-09-26 LAB — CBC
HCT: 38.5 % (ref 36.0–46.0)
Hemoglobin: 12.4 g/dL (ref 12.0–15.0)
MCH: 28.6 pg (ref 26.0–34.0)
MCHC: 32.2 g/dL (ref 30.0–36.0)
MCV: 88.9 fL (ref 80.0–100.0)
Platelets: 275 10*3/uL (ref 150–400)
RBC: 4.33 MIL/uL (ref 3.87–5.11)
RDW: 13.7 % (ref 11.5–15.5)
WBC: 8.2 10*3/uL (ref 4.0–10.5)
nRBC: 0 % (ref 0.0–0.2)

## 2023-09-26 LAB — TROPONIN I (HIGH SENSITIVITY): Troponin I (High Sensitivity): 3 ng/L (ref <18)

## 2023-09-26 MED ORDER — ACETAMINOPHEN 325 MG PO TABS
650.0000 mg | ORAL_TABLET | Freq: Once | ORAL | Status: AC
  Administered 2023-09-26: 650 mg via ORAL
  Filled 2023-09-26: qty 2

## 2023-09-26 NOTE — ED Triage Notes (Signed)
 Rt and lt sided chest pain for one hour with dizziness and she is c/o pain in her rt arm

## 2023-09-27 ENCOUNTER — Emergency Department (HOSPITAL_COMMUNITY)

## 2023-09-27 DIAGNOSIS — J9811 Atelectasis: Secondary | ICD-10-CM | POA: Diagnosis not present

## 2023-09-27 DIAGNOSIS — K760 Fatty (change of) liver, not elsewhere classified: Secondary | ICD-10-CM | POA: Diagnosis not present

## 2023-09-27 DIAGNOSIS — R079 Chest pain, unspecified: Secondary | ICD-10-CM | POA: Diagnosis not present

## 2023-09-27 DIAGNOSIS — R7989 Other specified abnormal findings of blood chemistry: Secondary | ICD-10-CM | POA: Diagnosis not present

## 2023-09-27 LAB — D-DIMER, QUANTITATIVE: D-Dimer, Quant: 0.89 ug{FEU}/mL — ABNORMAL HIGH (ref 0.00–0.50)

## 2023-09-27 LAB — TROPONIN I (HIGH SENSITIVITY): Troponin I (High Sensitivity): <2 ng/L (ref <18)

## 2023-09-27 MED ORDER — ONDANSETRON HCL 4 MG/2ML IJ SOLN
4.0000 mg | Freq: Once | INTRAMUSCULAR | Status: AC
  Administered 2023-09-27: 4 mg via INTRAVENOUS
  Filled 2023-09-27: qty 2

## 2023-09-27 MED ORDER — IOHEXOL 350 MG/ML SOLN
75.0000 mL | Freq: Once | INTRAVENOUS | Status: AC | PRN
  Administered 2023-09-27: 75 mL via INTRAVENOUS

## 2023-09-27 MED ORDER — KETOROLAC TROMETHAMINE 15 MG/ML IJ SOLN
15.0000 mg | Freq: Once | INTRAMUSCULAR | Status: AC
  Administered 2023-09-27: 15 mg via INTRAVENOUS
  Filled 2023-09-27: qty 1

## 2023-09-27 NOTE — ED Notes (Signed)
 Patient returned from CT

## 2023-09-27 NOTE — ED Notes (Signed)
 Patient transported to CT

## 2023-09-27 NOTE — Discharge Instructions (Addendum)
 Your workup today was overall reassuring.  Your heart enzymes were normal and EKG appeared normal; does not appear like you are having a heart attack.  CT scan of your chest did not show evidence of blood clot in your lung, collapsed lung, pneumonia or other abnormality.  Given your tenderness in your chest wall, suspect there could be a musculoskeletal component.  Recommend continued use of Tylenol at home for treatment of your pain and follow-up with primary care.  Please do not hesitate to return to emergency department if the worrisome signs and symptoms we discussed become apparent.

## 2023-09-27 NOTE — ED Provider Notes (Signed)
 Kathryn Skinner Provider Note   CSN: [id number] Arrival date & time: 09/26/23  2146     History  Chief Complaint  Patient presents with   Chest Pain    Kathryn Skinner is a 53 y.o. female.   Chest Pain   53 year old female presents emergency department with complaints of chest pain, lightheadedness.  Patient states that symptoms began around 8 PM yesterday evening.  States that she was yelling at her grandchildren" when symptoms began.  Reports right-sided chest pain with some radiation across her chest to the left.  Reports mild associated shortness of breath as well as feelings of lightheadedness.  Denies any fever, cough, abdominal pain, urinary symptoms, change in bowel habits.  Denies history of similar symptoms in the past.  Denies any personal family history of cardiac issues.  States the pain is slightly worsened with taking a deep breath.  Past medical history significant for fibromyalgia, spindle cell carcinoma, breast cancer, thyroid disease  Home Medications Prior to Admission medications   Medication Sig Start Date End Date Taking? Authorizing Provider  atorvastatin (LIPITOR) 20 MG tablet Take 1 tablet (20 mg total) by mouth daily. 09/23/23   Donita Brooks, MD  levothyroxine (SYNTHROID) 100 MCG tablet Take 1 tablet (100 mcg total) by mouth daily. 09/23/23   Donita Brooks, MD  meloxicam (MOBIC) 15 MG tablet Take 1 tablet (15 mg total) by mouth daily. 09/16/23   Donita Brooks, MD      Allergies    Sulfamethoxazole-trimethoprim    Review of Systems   Review of Systems  Cardiovascular:  Positive for chest pain.  All other systems reviewed and are negative.   Physical Exam Updated Vital Signs BP 109/72 (BP Location: Left Arm)   Pulse 87   Temp 97.9 F (36.6 C) (Oral)   Resp 16   Ht 5\' 5"  (1.651 m)   Wt 84.8 kg   LMP  (LMP Unknown)   SpO2 100%   BMI 31.11 kg/m  Physical Exam Vitals and nursing note  reviewed.  Constitutional:      General: She is not in acute distress.    Appearance: She is well-developed.  HENT:     Head: Normocephalic and atraumatic.  Eyes:     Conjunctiva/sclera: Conjunctivae normal.  Cardiovascular:     Rate and Rhythm: Normal rate and regular rhythm.     Pulses: Normal pulses.     Heart sounds: No murmur heard. Pulmonary:     Effort: Pulmonary effort is normal. No respiratory distress.     Breath sounds: Normal breath sounds. No wheezing, rhonchi or rales.     Comments: Chest wall tenderness. Chest:     Chest wall: Tenderness present.  Abdominal:     Palpations: Abdomen is soft.     Tenderness: There is no abdominal tenderness. There is no guarding.  Musculoskeletal:        General: No swelling.     Cervical back: Neck supple.  Skin:    General: Skin is warm and dry.     Capillary Refill: Capillary refill takes less than 2 seconds.  Neurological:     Mental Status: She is alert.  Psychiatric:        Mood and Affect: Mood normal.     ED Results / Procedures / Treatments   Labs (all labs ordered are listed, but only abnormal results are displayed) Labs Reviewed  BASIC METABOLIC PANEL WITH GFR - Abnormal; Notable for  the following components:      Result Value   Glucose, Bld 102 (*)    BUN 21 (*)    All other components within normal limits  D-DIMER, QUANTITATIVE - Abnormal; Notable for the following components:   D-Dimer, Quant 0.89 (*)    All other components within normal limits  CBC  TROPONIN I (HIGH SENSITIVITY)  TROPONIN I (HIGH SENSITIVITY)    EKG None  Radiology CT Angio Chest PE W/Cm &/Or Wo Cm Result Date: 09/27/2023 CLINICAL DATA:  Positive D-dimer and chest pain. EXAM: CT ANGIOGRAPHY CHEST WITH CONTRAST TECHNIQUE: Multidetector CT imaging of the chest was performed using the standard protocol during bolus administration of intravenous contrast. Multiplanar CT image reconstructions and MIPs were obtained to evaluate the  vascular anatomy. RADIATION DOSE REDUCTION: This exam was performed according to the departmental dose-optimization program which includes automated exposure control, adjustment of the mA and/or kV according to patient size and/or use of iterative reconstruction technique. CONTRAST:  75mL OMNIPAQUE IOHEXOL 350 MG/ML SOLN COMPARISON:  AP and lateral chest yesterday, CTA abdomen pelvis contrast 09/24/2016. FINDINGS: Cardiovascular: the cardiac size is normal. The pulmonary arteries and veins are normal caliber. No arterial embolism is seen. There is no visible coronary calcification, no pericardial effusion. The aorta and great vessels are normal. Mediastinum/Nodes: No enlarged mediastinal, hilar, or axillary lymph nodes. Thyroid gland, trachea, and esophagus demonstrate no significant findings. Lungs/Pleura: There is mild posterior atelectasis in the lower lobes. Asymmetric subpleural reticulation in the right middle lobe is seen and could be due to XRT for prior breast cancer. No consolidation, effusion or lung nodules are seen. Upper Abdomen: No acute abnormality.  Mild hepatic steatosis. Musculoskeletal: Slight thoracic kyphodextroscoliosis. Mild degenerative change dorsal spine. No acute or other significant osseous findings. Old right mastectomy. No chest wall mass is seen. Review of the MIP images confirms the above findings. IMPRESSION: 1. No evidence of arterial embolus or other acute chest CT findings. 2. Mild posterior atelectasis in the lower lobes. 3. Asymmetric subpleural reticulation in the right middle lobe which could be due to XRT for prior breast cancer. Appears chronic. 4. Mild hepatic steatosis. 5. Old right mastectomy. Electronically Signed   By: Almira Bar M.D.   On: 09/27/2023 04:31   DG Chest 2 View Result Date: 09/26/2023 CLINICAL DATA:  Chest pain and dizziness. EXAM: CHEST - 2 VIEW COMPARISON:  July 23, 2017 FINDINGS: The heart size and mediastinal contours are within normal  limits. Both lungs are clear. The visualized skeletal structures are unremarkable. IMPRESSION: No active cardiopulmonary disease. Electronically Signed   By: Aram Candela M.D.   On: 09/26/2023 22:59    Procedures Procedures    Medications Ordered in ED Medications  acetaminophen (TYLENOL) tablet 650 mg (650 mg Oral Given 09/26/23 2240)  ketorolac (TORADOL) 15 MG/ML injection 15 mg (15 mg Intravenous Given 09/27/23 0252)  ondansetron (ZOFRAN) injection 4 mg (4 mg Intravenous Given 09/27/23 0251)  iohexol (OMNIPAQUE) 350 MG/ML injection 75 mL (75 mLs Intravenous Contrast Given 09/27/23 0358)    ED Course/ Medical Decision Making/ A&P                                 Medical Decision Making  This patient presents to the ED for concern of chest pain, this involves an extensive number of treatment options, and is a complaint that carries with it a high risk of complications and morbidity.  The  differential diagnosis includes ACS, PE, pneumothorax, pneumonia, pericarditis/myocarditis/tamponade, GERD, musculoskeletal, other   Co morbidities that complicate the patient evaluation  See HPI   Additional history obtained:  Additional history obtained from EMR External records from outside source obtained and reviewed including [facility] records   Lab Tests:  I Ordered, and personally interpreted labs.  The pertinent results include: No leukocytosis.  No evidence of anemia.  Placed within range.  No electrolyte abnormalities.  No renal dysfunction.  Troponin of less than 2 with repeat less than 2.  D-dimer elevated at 0.89   Imaging Studies ordered:  I ordered imaging studies including chest x-ray, CT angio chest PE I independently visualized and interpreted imaging which showed: Chest x-ray: No acute cardiopulmonary abnormality. CT angio chest PE: No evidence of PE.  Mild posterior atelectasis bilateral lower lobes.  Asymmetric subpleural reticulation right middle lobe.  Mild hepatic  steatosis.  Old right mastectomy. I agree with the radiologist interpretation  Cardiac Monitoring: / EKG:  The patient was maintained on a cardiac monitor.  I personally viewed and interpreted the cardiac monitored which showed an underlying rhythm of: Normal sinus rhythm   Consultations Obtained:  N/a   Problem List / ED Course / Critical interventions / Medication management  Chest pain I ordered medication including Tylenol, Zofran, Toradol   Reevaluation of the patient after these medicines showed that the patient improved I have reviewed the patients home medicines and have made adjustments as needed   Social Determinants of Health:  Denies tobacco, licit drug use.   Test / Admission - Considered:  Chest pain Vitals signs significant for hypertension blood pressure 155/93. Otherwise within normal range and stable throughout visit. Laboratory/imaging studies significant for: See above 53 year old female presents emergency department with complaints of chest pain, lightheadedness.  Patient states that symptoms began around 8 PM yesterday evening.  States that she was yelling at her grandchildren" when symptoms began.  Reports right-sided chest pain with some radiation across her chest to the left.  Reports mild associated shortness of breath as well as feelings of lightheadedness.  Denies any fever, cough, abdominal pain, urinary symptoms, change in bowel habits.  Denies history of similar symptoms in the past.  Denies any personal family history of cardiac issues.  States the pain is slightly worsened with taking a deep breath. On exam, lungs clear to auscultation bilaterally.  Diffuse chest wall tenderness.  Workup reassuring.  Delta negative troponin, lack of acute ischemic change on EKG; low suspicion for ACS.  Patient with history of breast cancer with chest pain described somewhat as pleuritic in nature; D-dimer was obtained which was elevated.  Subsequent CT PE study was  obtained which was negative for PE.  Chest imaging also negative for pneumonia, pneumothorax, pulmonary vas congestion or other acute cardiopulmonary abnormality.  Patient without chest pain rating to back, pulse deficits, neurodeficits, hypertension; low suspicion for aortic dissection.  Suspect more musculoskeletal cause of patient's chest discomfort.  Recommend treatment of pain at home with Tylenol and follow-up with PCP in the outpatient setting.  Treatment plan discussed with patient and she acknowledged understanding was agreeable to said plan.  Patient overall well-appearing, afebrile in no acute distress. Worrisome signs and symptoms were discussed with the patient, and the patient acknowledged understanding to return to the ED if noticed. Patient was stable upon discharge.          Final Clinical Impression(s) / ED Diagnoses Final diagnoses:  Chest pain, unspecified type    Rx /  DC Orders ED Discharge Orders     None         Peter Garter, [place] 09/27/23 1610    Shon Baton, MD 09/27/23 [id number]

## 2023-10-20 ENCOUNTER — Ambulatory Visit: Payer: Self-pay

## 2023-10-20 NOTE — Telephone Encounter (Signed)
 Chief Complaint: Sore Throat Symptoms: tonsil swelling, left ear pain Frequency: Intermittent x 1 month Pertinent Negatives: Patient denies fever, nasal congestion Disposition: [] ED /[] Urgent Care (no appt availability in office) / [x] Appointment(In office/virtual)/ []  [place] [facility]/ [] Home Care/ [] Refused Recommended Disposition /[] [place] [facility]/ []  Follow-up with PCP Additional Notes: Pt reports she has been experiencing 9/10 ST, tonsil swelling and hoarse voice intermittently x 1 month. Pt has tried most OTC remedies but symptoms continues to return. OV scheduled. This RN educated pt on home care, new-worsening symptoms, when to call back/seek emergent care. Pt verbalized understanding and agrees to plan.    Copied from CRM [id number]. Topic: Clinical - Red Word Triage >> Oct 20, 2023 10:26 AM Juluis Ok wrote: [facility] that prompted transfer to Nurse Triage: Sore throat, tonsil swelling Reason for Disposition  Earache also present  Answer Assessment - Initial Assessment Questions /1. ONSET: "When did the throat start hurting?" (Hours or days ago)      About 1 month, intermittent 2. SEVERITY: "How bad is the sore throat?" (Scale 1-10; mild, moderate or severe)   - MILD (1-3):  Doesn't interfere with eating or normal activities.   - MODERATE (4-7): Interferes with eating some solids and normal activities.   - SEVERE (8-10):  Excruciating pain, interferes with most normal activities.   - SEVERE WITH DYSPHAGIA (10): Can't swallow liquids, drooling.     9/10 3. STREP EXPOSURE: "Has there been any exposure to strep within the past week?" If Yes, ask: "What type of contact occurred?"      None 4.  VIRAL SYMPTOMS: "Are there any symptoms of a cold, such as a runny nose, cough, hoarse voice or red eyes?"      None 5. FEVER: "Do you have a fever?" If Yes, ask: "What is your temperature, how was it measured, and when did it start?"     None 6. PUS ON THE TONSILS: "Is there pus  on the tonsils in the back of your throat?"     None 7. OTHER SYMPTOMS: "Do you have any other symptoms?" (e.g., difficulty breathing, headache, rash)     None  Protocols used: Sore Throat-A-AH

## 2023-10-21 ENCOUNTER — Ambulatory Visit: Admitting: Family Medicine

## 2023-10-21 ENCOUNTER — Ambulatory Visit: Payer: Self-pay

## 2023-10-21 NOTE — Telephone Encounter (Signed)
 Chief Complaint: reschedule appt  Disposition: [] ED /[] Urgent Care (no appt availability in office) / [x] Appointment(In office/virtual)/ []  [place] [facility]/ [] Home Care/ [] Refused Recommended Disposition /[] [place]/ []  Follow-up with PCP Additional Notes: pt states she was on her way to her appt and stuck in traffic and wont make it there just wanted to reschedule.   Copied from CRM [id number]. Topic: Clinical - Red Word Triage >> Oct 21, 2023  3:48 PM Rachelle R wrote: [facility] that prompted transfer to Nurse Triage: Swelling, Patient has a sore throat and swelling of her tonsils.  (Stuck in traffic unable to make the appointment scheduled for today 5/1) Reason for Disposition  General information question, no triage required and triager able to answer question  Answer Assessment - Initial Assessment Questions 1. REASON FOR CALL or QUESTION: "What is your reason for calling today?" or "How can I best help you?" or "What question do you have that I can help answer?"     Needed to reschedule appt from today due to traffic  Protocols used: Information Only Call - No Triage-A-AH

## 2023-10-22 ENCOUNTER — Ambulatory Visit: Admitting: Family Medicine

## 2023-10-22 ENCOUNTER — Encounter: Payer: Self-pay | Admitting: Family Medicine

## 2023-10-22 VITALS — BP 130/82 | HR 109 | Temp 98.3°F | Ht 65.0 in | Wt 188.0 lb

## 2023-10-22 DIAGNOSIS — J029 Acute pharyngitis, unspecified: Secondary | ICD-10-CM | POA: Diagnosis not present

## 2023-10-22 MED ORDER — LEVOCETIRIZINE DIHYDROCHLORIDE 5 MG PO TABS: 5.0000 mg | ORAL_TABLET | Freq: Every evening | ORAL | 5 refills | Status: AC

## 2023-10-22 MED ORDER — FLUTICASONE PROPIONATE 50 MCG/ACT NA SUSP: 2.0000 | Freq: Every day | NASAL | 6 refills | Status: AC

## 2023-10-22 NOTE — Progress Notes (Signed)
 Subjective:    Patient ID: Kathryn Skinner, female    DOB: Sep 04, 1970, 53 y.o.   MRN: [id number]  Sore Throat     Patient reports a 1 month history of a sore throat.  She also reports left ear fullness and mild cough congestion in her sinuses and in her throat.  Strep test today is negative.  She denies any fevers or chills.  She denies any hemoptysis.  She denies any lymphadenopathy in the neck. 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   Thyroid  disease    hypothyroid   Past Surgical History:  Procedure Laterality Date   BREAST SURGERY     CHOLECYSTECTOMY     COLONOSCOPY N/A 11/27/2015   Procedure: COLONOSCOPY;  Surgeon: Kenney Peacemaker, MD;  [facility] ENDOSCOPY;  Service: Endoscopy;  Laterality: N/A;   MASTECTOMY Bilateral 2017   PORT-A-CATH REMOVAL     PORTACATH PLACEMENT     Current Outpatient Medications on File Prior to Visit  Medication Sig Dispense Refill   atorvastatin  (LIPITOR) 20 MG tablet Take 1 tablet (20 mg total) by mouth daily. 90 tablet 3   levothyroxine  (SYNTHROID ) 100 MCG tablet Take 1 tablet (100 mcg total) by mouth daily. 90 tablet 3   meloxicam  (MOBIC ) 15 MG tablet Take 1 tablet (15 mg total) by mouth daily. 30 tablet 2   No current facility-administered medications on file prior to visit.   Allergies  Allergen Reactions   Sulfamethoxazole-Trimethoprim Rash   Social History   Socioeconomic History   Marital status: Media planner    Spouse name: Not on file   Number of children: 1   Years of education: 12   Highest education level: Not on file   Occupational History   Occupation: disabled    Comment: vision impairment  Tobacco Use   Smoking status: Never   Smokeless tobacco: Never   Tobacco comments:    N/I  Vaping Use   Vaping status: Never Used  Substance and Sexual Activity   Alcohol use: No   Drug use: No   Sexual activity: Yes    Birth control/protection: Post-menopausal  Other Topics Concern   Not on file  Social History Narrative   Lives at home BF   Vision impaired since age 70, has eye disease that disables from work   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, daughter, with new baby 2018   Social Drivers of Corporate investment banker Strain: Low Risk  (06/10/2022)   Overall Financial Resource Strain (CARDIA)    Difficulty of Paying Living Expenses: Not hard at all  Food Insecurity: No Food Insecurity (06/10/2022)   Hunger Vital Sign    Worried About Running Out of Food in the Last Year: Never true    Ran Out of Food in the Last Year: Never true  Transportation Needs: No Transportation Needs (06/10/2022)   PRAPARE - Administrator, Civil Service (Medical): No    Lack of Transportation (Non-Medical): No  Physical Activity: Inactive (06/10/2022)   Exercise Vital Sign    Days of Exercise per Week: 0 days    Minutes of Exercise per Session: 0 min  Stress: No Stress Concern Present (06/10/2022)   [employer] of Occupational Health - Occupational Stress Questionnaire    Feeling of Stress : Only a little  Social Connections: Socially Isolated (06/10/2022)   Social Connection and Isolation Panel [NHANES]    Frequency of Communication with Friends and Family: More than three times a week    Frequency of Social Gatherings with Friends and Family: Once a week    Attends Religious Services: Never    Database administrator or Organizations: No    Attends Banker Meetings: Never    Marital Status: Divorced  Catering manager Violence: Not At Risk (06/10/2022)   Humiliation, Afraid, Rape, and  Kick questionnaire    Fear of Current or Ex-Partner: No    Emotionally Abused: No    Physically Abused: No    Sexually Abused: No   Family History  Problem Relation Age of Onset   Thyroid  disease Maternal Grandmother    Emphysema Maternal Grandmother    Cancer Maternal Grandmother        lung   Alcohol abuse Maternal Grandfather    Cancer Maternal Grandfather        pancreatic   Thyroid  disease Mother    Thyroid  disease Sister    Asthma Sister    Liver disease Maternal Aunt    Breast cancer Maternal Uncle    Other Maternal Uncle        eye disease     Review of Systems     Objective:   Physical Exam Vitals reviewed.  Constitutional:      General: She is not in acute distress.    Appearance: Normal appearance. She is not ill-appearing or toxic-appearing.  HENT:     Right Ear: Tympanic membrane and ear canal normal.     Left Ear: Tympanic membrane and ear canal normal.     Nose: Congestion present.     Mouth/Throat:     Mouth: Mucous membranes are moist.     Pharynx: Oropharynx is clear. No oropharyngeal exudate or posterior oropharyngeal erythema.     Tonsils: No tonsillar exudate.  Eyes:     Conjunctiva/sclera: Conjunctivae normal.  Cardiovascular:     Rate and Rhythm: Normal rate and regular rhythm.     Heart sounds: Normal heart sounds. No murmur heard.    No friction rub. No gallop.  Pulmonary:     Effort: Pulmonary effort is normal. No respiratory distress.     Breath sounds: Normal breath sounds. No wheezing, rhonchi or rales.  Musculoskeletal:     Right lower leg: No edema.     Left lower leg: No edema.  Neurological:     Mental Status: She is alert.           Assessment & Plan:   Sore throat - Plan: STREP GROUP A AG, W/REFLEX TO CULT I believe that the sore throat, hoarseness, the raspy voice, and the cough are due to chronic drainage over the last month.  She also has fullness in her left ear which I believe is due to eustachian tube  dysfunction.  I believe this is all secondary to allergies and sinus inflammation.  We will try treating this with Xyzal 5 mg daily coupled with Flonase  2 sprays each nostril daily.  Recheck if no better in 2 weeks  or sooner if worse.

## 2023-10-24 LAB — CULTURE, GROUP A STREP
Micro Number: 16406061
SPECIMEN QUALITY:: ADEQUATE

## 2023-10-24 LAB — STREP GROUP A AG, W/REFLEX TO CULT: Streptococcus Group A AG: NOT DETECTED

## 2023-11-24 ENCOUNTER — Ambulatory Visit: Payer: Self-pay | Admitting: *Deleted

## 2023-11-24 NOTE — Telephone Encounter (Signed)
 FYI Only or Action Required?: FYI only for provider  Patient was last seen in primary care on 10/22/23. Called Nurse Triage reporting ant bite. Symptoms began several days ago. Interventions attempted: OTC medications: alcohol. Symptoms are: gradually worsening.  Triage Disposition: See PCP When Office is Open (Within 3 Days)  Patient/caregiver understands and will follow disposition?: Yes  Reason for Disposition  Bite starts to look bad (e.g., blister, purplish skin, ulcer)  (Exception: There is just minor swelling or small red bump.)  Answer Assessment - Initial Assessment Questions 1. TYPE of INSECT: "What type of insect was it?"      Ant bite 2. ONSET: "When did you get bitten?"      Friday 3. LOCATION: "Where is the insect bite located?"      R ankle-outer  4. REDNESS: "Is the area red or pink?" If Yes, ask: "What size is area of redness?" (inches or cm). "When did the redness start?"     Red- 1-1 1/2 inch, over 24 hours- itched 5. PAIN: "Is there any pain?" If Yes, ask: "How bad is it?"  (Scale 1-10; or mild, moderate, severe)     Not painful 6. ITCHING: "Does it itch?" If Yes, ask: "How bad is the itch?"    - MILD: doesn't interfere with normal activities   - MODERATE-SEVERE: interferes with work, school, sleep, or other activities      No itching- burning present 7. SWELLING: "How big is the swelling?" (inches, cm, or compare to coins)     Comes and goes at site 8. OTHER SYMPTOMS: "Do you have any other symptoms?"  (e.g., difficulty breathing, hives)     no  Protocols used: Insect Bite-A-AH      Copied from CRM [id number]. Topic: Clinical - Red Word Triage >> Nov 24, 2023  9:29 AM Carlatta H wrote: [facility] that prompted transfer to Nurse Triage: Friday patient was bitten by an ant on her right ankle//Redness, swelling and burning

## 2023-11-25 ENCOUNTER — Ambulatory Visit (INDEPENDENT_AMBULATORY_CARE_PROVIDER_SITE_OTHER): Admitting: Family Medicine

## 2023-11-25 ENCOUNTER — Encounter: Payer: Self-pay | Admitting: Family Medicine

## 2023-11-25 VITALS — BP 117/78 | HR 92 | Temp 98.1°F | Ht 65.0 in | Wt 188.4 lb

## 2023-11-25 DIAGNOSIS — S90561A Insect bite (nonvenomous), right ankle, initial encounter: Secondary | ICD-10-CM

## 2023-11-25 DIAGNOSIS — W57XXXA Bitten or stung by nonvenomous insect and other nonvenomous arthropods, initial encounter: Secondary | ICD-10-CM | POA: Diagnosis not present

## 2023-11-25 NOTE — Assessment & Plan Note (Signed)
 No systemic symptoms. There is some mild surrounding erythema at the site. She is certain there was no tick and rash is not consistent with erythema migrans. Encouraged to continue to monitor for worsening or spreading redness, blistering, or darkening of the area as well as systemic symptoms such as fever, chills, body aches, N/V/D, headache, malaise and seek medical care for any of these.

## 2023-11-25 NOTE — Progress Notes (Signed)
 Subjective:  HPI: Kathryn Skinner is a 53 y.o. female presenting on 11/25/2023 for Acute Visit (Ant bite .Bite on rt ankle large red area )   HPI Patient is in today for big bite that occurred on Friday that was presumed a mosquito bite. She is certain there was no tick bite however she did not see the insect. She reports redness to the surrounding area. Denies itching, swelling, blistering, fever, chills, body aches, N/V/D, malaise, headaches. Has tried nothing.  Review of Systems  All other systems reviewed and are negative.   Relevant past medical history reviewed and updated as indicated.   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   Current Outpatient Medications:    atorvastatin  (LIPITOR) 20 MG tablet, Take 1 tablet (20 mg total) by mouth daily., Disp: 90 tablet, Rfl: 3   fluticasone  (FLONASE ) 50 MCG/ACT nasal spray, Place 2 sprays into both nostrils daily., Disp: 16 g, Rfl: 6   levocetirizine (XYZAL  ALLERGY 24HR) 5 MG tablet, Take 1 tablet (5 mg total) by mouth every evening., Disp: 30 tablet, Rfl: 5   levothyroxine  (SYNTHROID ) 100 MCG tablet, Take 1 tablet (100 mcg total) by mouth  daily., Disp: 90 tablet, Rfl: 3   meloxicam  (MOBIC ) 15 MG tablet, Take 1 tablet (15 mg total) by mouth daily., Disp: 30 tablet, Rfl: 2  Allergies  Allergen Reactions   Sulfamethoxazole-Trimethoprim Rash    Objective:   BP 117/78   Pulse 92   Temp 98.1 F (36.7 C)   Ht 5\' 5"  (1.651 m)   Wt 188 lb 6.4 oz (85.5 kg)   LMP  (LMP Unknown)   SpO2 96%   BMI 31.35 kg/m      11/25/2023    9:29 AM 10/22/2023    2:24 PM 09/27/2023    4:42 AM  Vitals with BMI  Height 5\' 5"  5\' 5"    Weight 188 lbs 6 oz 188 lbs   BMI 31.35 31.28   Systolic 117 130 387  Diastolic 78 82 72  Pulse 92 109 87     Physical Exam Vitals and nursing note reviewed.  Constitutional:      Appearance: Normal appearance. She is normal weight.  HENT:     Head: Normocephalic and atraumatic.  Skin:    General: Skin is warm and dry.     Findings: Erythema present.     Comments: 3cm flush erythema with central punctate scab  Neurological:     General: No focal deficit present.     Mental Status:  She is alert and oriented to person, place, and time. Mental status is at baseline.  Psychiatric:        Mood and Affect: Mood normal.        Behavior: Behavior normal.        Thought Content: Thought content normal.        Judgment: Judgment normal.     Assessment & Plan:  Bug bite, initial encounter Assessment & Plan: No systemic symptoms. There is some mild surrounding erythema at the site. She is certain there was no tick and rash is not consistent with erythema migrans. Encouraged to continue to monitor for worsening or spreading redness, blistering, or darkening of the area as well as systemic symptoms such as fever, chills, body aches, N/V/D, headache, malaise and seek medical care for any of these.      Follow up plan: Return if symptoms worsen or fail to improve.  Jenelle Mis, FNP

## 2023-12-07 ENCOUNTER — Ambulatory Visit (HOSPITAL_COMMUNITY): Admission: EM | Admit: 2023-12-07 | Discharge: 2023-12-07 | Disposition: A | Discharge status: Home / Self Care

## 2023-12-07 ENCOUNTER — Encounter (HOSPITAL_COMMUNITY): Payer: Self-pay

## 2023-12-07 DIAGNOSIS — S90861A Insect bite (nonvenomous), right foot, initial encounter: Secondary | ICD-10-CM | POA: Diagnosis not present

## 2023-12-07 DIAGNOSIS — W57XXXA Bitten or stung by nonvenomous insect and other nonvenomous arthropods, initial encounter: Secondary | ICD-10-CM

## 2023-12-07 MED ORDER — DOXYCYCLINE HYCLATE 100 MG PO CAPS: 100.0000 mg | ORAL_CAPSULE | Freq: Two times a day (BID) | ORAL | 0 refills | Status: AC

## 2023-12-07 NOTE — Discharge Instructions (Addendum)
  1. Insect bite of right foot, initial encounter (Primary) - doxycycline  (VIBRAMYCIN ) 100 MG capsule; Take 1 capsule (100 mg total) by mouth 2 (two) times daily for 7 days.  Dispense: 14 capsule; Refill: 0 - Continue to take Benadryl  25 to 50 mg before bed to help with swelling and itching overnight. - Apply steroid ointment/cream over affected skin 2-3 times daily to help with itching and inflammation. -Continue to monitor symptoms for any change in severity if there is any escalation of current symptoms or development of new symptoms follow-up in urgent care or ER for further evaluation and management.

## 2023-12-07 NOTE — ED Triage Notes (Signed)
 Pt states possible bug bite to her right foot on 11/21/2023.  Right foot swollen with red bumps on it.  States she has been using steroid cream on it at home with no change in symptoms.

## 2023-12-07 NOTE — ED Provider Notes (Signed)
 [facility]  Note:  This document was prepared using Dragon voice recognition software and may include unintentional dictation errors.  MRN: [id number] DOB: June 25, 1970  Subjective:   Kathryn Skinner is a 53 y.o. female presenting for redness and swelling to the right foot after suspected insect bites.  Patient reports that she initially got bug bites on her foot around 1 June and since then foot has been itchy with red bumps and increased swelling to her foot.  Patient has been using steroid cream at home with minimal improvement to symptoms.  Patient denies any fever, severe pain, severe erythema or warmth.  Patient was concern for possible infection.  No current facility-administered medications for this encounter.  Current Outpatient Medications:    doxycycline  (VIBRAMYCIN ) 100 MG capsule, Take 1 capsule (100 mg total) by mouth 2 (two) times daily for 7 days., Disp: 14 capsule, Rfl: 0   atorvastatin  (LIPITOR) 20 MG tablet, Take 1 tablet (20 mg total) by mouth daily., Disp: 90 tablet, Rfl: 3   fluticasone  (FLONASE ) 50 MCG/ACT nasal spray, Place 2 sprays into both nostrils daily., Disp: 16 g, Rfl: 6   levocetirizine (XYZAL  ALLERGY 24HR) 5 MG tablet, Take 1 tablet (5 mg total) by mouth every evening., Disp: 30 tablet, Rfl: 5   levothyroxine  (SYNTHROID ) 100 MCG tablet, Take 1 tablet (100 mcg total) by mouth daily., Disp: 90 tablet, Rfl: 3   meloxicam  (MOBIC ) 15 MG tablet, Take 1 tablet (15 mg total) by mouth daily., Disp: 30 tablet, Rfl: 2   Allergies  Allergen Reactions   Sulfamethoxazole-Trimethoprim Rash    Past Medical History:  Diagnosis Date   Allergy    Breast cancer (HCC) 2016   Right Breast   Breast cancer (HCC)        Breast disorder    cancer   Encounter for well woman exam with routine gynecological exam 06/02/2021   Eye disease    Fibromyalgia    Fibromyalgia 08/21/2020   Hyperlipidemia 08/21/2020   Hypothyroidism    Retinitis pigmentosa     Retinitis pigmentosa    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 ENDOSCOPY;  Service: Endoscopy;  Laterality: N/A;   MASTECTOMY Bilateral 2017   PORT-A-CATH REMOVAL     PORTACATH PLACEMENT      Family History  Problem Relation Age of Onset   Thyroid  disease Maternal Grandmother    Emphysema Maternal Grandmother    Cancer Maternal Grandmother        lung   Alcohol abuse Maternal Grandfather    Cancer Maternal Grandfather        pancreatic   Thyroid  disease Mother    Thyroid  disease Sister    Asthma Sister    Liver disease Maternal Aunt    Breast cancer Maternal Uncle    Other Maternal Uncle        eye disease    Social History   Tobacco Use   Smoking status: Never   Smokeless tobacco: Never   Tobacco comments:    N/I  Vaping Use   Vaping status: Never Used  Substance Use Topics   Alcohol use: No   Drug use: No    ROS Refer to HPI for ROS  details.  Objective:   Vitals: BP 120/83 (BP Location: Left Arm)   Pulse (!) 102   Temp 98.1 F (36.7 C) (Oral)   Resp 16   LMP  (LMP Unknown)   SpO2 95%   Physical Exam Vitals and nursing note reviewed.  Constitutional:      General: She is not in acute distress.    Appearance: She is well-developed. She is not ill-appearing or toxic-appearing.  HENT:     Head: Normocephalic and atraumatic.     Mouth/Throat:     Mouth: Mucous membranes are moist.   Cardiovascular:     Rate and Rhythm: Normal rate.  Pulmonary:     Effort: Pulmonary effort is normal. No respiratory distress.   Skin:    General: Skin is warm and dry.     Findings: Abrasion and rash present. No erythema. Rash is papular.   Neurological:     General: No focal deficit present.     Mental Status: She is alert and  oriented to person, place, and time.   Psychiatric:        Mood and Affect: Mood normal.        Behavior: Behavior normal.     Procedures  No results found for this or any previous visit (from the past 24 hours).  No results found.   Assessment and Plan :     Discharge Instructions       1. Insect bite of right foot, initial encounter (Primary) - doxycycline  (VIBRAMYCIN ) 100 MG capsule; Take 1 capsule (100 mg total) by mouth 2 (two) times daily for 7 days.  Dispense: 14 capsule; Refill: 0 - Continue to take Benadryl  25 to 50 mg before bed to help with swelling and itching overnight. - Apply steroid ointment/cream over affected skin 2-3 times daily to help with itching and inflammation. -Continue to monitor symptoms for any change in severity if there is any escalation of current symptoms or development of new symptoms follow-up in urgent care or ER for further evaluation and management.      Lurlie Wigen B Ikea Demicco   Chasen Mendell, [place], [place] 12/07/23 1944

## 2023-12-15 ENCOUNTER — Telehealth: Payer: Self-pay

## 2023-12-15 NOTE — Telephone Encounter (Signed)
 Copied from CRM [id number]. Topic: Clinical - Medication Question >> Dec 15, 2023  3:50 PM Emylou G wrote: Reason for CRM: Patient said almost out of this medication: doxyclcine.. hoping to get refill says it works for her ( urgent care prescribed to her ) from insect bite

## 2023-12-16 NOTE — Telephone Encounter (Signed)
 Lvm on identifiable vm box

## 2024-03-27 ENCOUNTER — Encounter (HOSPITAL_COMMUNITY): Payer: Self-pay | Admitting: Emergency Medicine

## 2024-03-27 ENCOUNTER — Ambulatory Visit (INDEPENDENT_AMBULATORY_CARE_PROVIDER_SITE_OTHER)

## 2024-03-27 ENCOUNTER — Ambulatory Visit (HOSPITAL_COMMUNITY)
Admission: EM | Admit: 2024-03-27 | Discharge: 2024-03-27 | Disposition: A | Discharge status: Home / Self Care | Attending: Internal Medicine | Admitting: Internal Medicine

## 2024-03-27 ENCOUNTER — Ambulatory Visit: Payer: Self-pay

## 2024-03-27 DIAGNOSIS — M542 Cervicalgia: Secondary | ICD-10-CM | POA: Diagnosis not present

## 2024-03-27 MED ORDER — BACLOFEN 10 MG PO TABS: 10.0000 mg | ORAL_TABLET | Freq: Three times a day (TID) | ORAL | 0 refills | Status: AC

## 2024-03-27 NOTE — ED Provider Notes (Signed)
 [facility]    CSN: [id number] Arrival date & time: 03/27/24  1600      History   Chief Complaint Chief Complaint  Patient presents with  . Back Pain    HPI Kathryn Skinner is a 53 y.o. female.   Kathryn Skinner is a 53 y.o. female presenting for chief complaint of upper back pain and swelling to the inferior cervical spine soft tissue starting 2 days ago.  Patient noticed swelling and warmth to the mid upper back 2 days ago and the swelling has increased in size over the last 24 hours.  Denies recent trauma/injuries to the upper back.  She has had a slight cough for the last several days that is nonproductive.  Denies shortness of breath, chest pain, heart palpitations, fever, chills, nausea, vomiting, and recent open wounds to the upper back.  Back pain is worsened by movement of the head and neck and the shoulders.  Pain starts to the mid upper back at the spine and radiates outward bilaterally.  She has been in remission for right sided breast cancer for the last 8 years.  Taking Tylenol  with minimal relief.   Back Pain   Past Medical History:  Diagnosis Date  . Allergy   . Breast cancer (HCC) 2016   Right Breast  . Breast cancer (HCC)       . Breast disorder    cancer  . Encounter for well woman exam with routine gynecological exam 06/02/2021  . Eye disease   . Fibromyalgia   . Fibromyalgia 08/21/2020  . Hyperlipidemia 08/21/2020  . Hypothyroidism   . Retinitis pigmentosa   . Retinitis pigmentosa   . 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 Thyroid  disease    hypothyroid    Patient Active Problem List   Diagnosis Date Noted  . Bug bite 11/25/2023  . Encounter for gynecological examination with Papanicolaou smear of cervix 06/10/2022  . Routine general medical examination at a health care facility 06/10/2022  . Encounter for screening fecal occult blood testing 06/02/2021  . Encounter for well woman exam with routine gynecological  exam 06/02/2021  . Postmenopause 06/02/2021  . Encounter to establish care 08/21/2020  . Chest pain 08/21/2020  . Fibromyalgia 08/21/2020  . Right leg pain 08/21/2020  . Hyperlipidemia 08/21/2020  . Screening for colorectal cancer 03/30/2019  . Routine cervical smear 03/30/2019  . History of breast cancer 03/30/2019  . Retinitis pigmentosa 12/10/2016  . Hypothyroidism 12/10/2016  . Visual impairment 12/10/2016  . Obesity (BMI 30-39.9) 08/13/2016  . S/P mastectomy, bilateral 09/13/2015    Past Surgical History:  Procedure Laterality Date  . BREAST SURGERY    . CHOLECYSTECTOMY    . COLONOSCOPY N/A 11/27/2015   Procedure: COLONOSCOPY;  Surgeon: Lupita FORBES Commander, MD;  Location: [facility] ENDOSCOPY;  Service: Endoscopy;  Laterality: N/A;  . MASTECTOMY Bilateral 2017  . PORT-A-CATH REMOVAL    . PORTACATH PLACEMENT      OB History     Gravida  2   Para  1   Term      Preterm      AB  1   Living  1      SAB  1   IAB      Ectopic      Multiple      Live Births  1            Home Medications    Prior to Admission medications  Medication Sig Start Date End Date Taking? Authorizing Provider  baclofen (LIORESAL) 10 MG tablet Take 1 tablet (10 mg total) by mouth 3 (three) times daily. 03/27/24  Yes Enedelia Dorna HERO, FNP  atorvastatin  (LIPITOR) 20 MG tablet Take 1 tablet (20 mg total) by mouth daily. 09/23/23   Duanne Butler DASEN, MD  fluticasone  (FLONASE ) 50 MCG/ACT nasal spray Place 2 sprays into both nostrils daily. Patient not taking: Reported on 03/27/2024 10/22/23   Duanne Butler DASEN, MD  levocetirizine (XYZAL  ALLERGY 24HR) 5 MG tablet Take 1 tablet (5 mg total) by mouth every evening. Patient not taking: Reported on 03/27/2024 10/22/23   Duanne Butler DASEN, MD  levothyroxine  (SYNTHROID ) 100 MCG tablet Take 1 tablet (100 mcg total) by mouth daily. 09/23/23   Duanne Butler DASEN, MD  meloxicam  (MOBIC ) 15 MG tablet Take 1 tablet (15 mg total) by mouth daily. Patient not  taking: Reported on 03/27/2024 09/16/23   Duanne Butler DASEN, MD    Family History Family History  Problem Relation Age of Onset  . Thyroid  disease Maternal Grandmother   . Emphysema Maternal Grandmother   . Cancer Maternal Grandmother        lung  . Alcohol abuse Maternal Grandfather   . Cancer Maternal Grandfather        pancreatic  . Thyroid  disease Mother   . Thyroid  disease Sister   . Asthma Sister   . Liver disease Maternal Aunt   . Breast cancer Maternal Uncle   . Other Maternal Uncle        eye disease    Social History Social History   Tobacco Use  . Smoking status: Never  . Smokeless tobacco: Never  . Tobacco comments:    N/I  Vaping Use  . Vaping status: Never Used  Substance Use Topics  . Alcohol use: No  . Drug use: No     Allergies   Sulfamethoxazole-trimethoprim   Review of Systems Review of Systems  Musculoskeletal:  Positive for back pain.  Per HPI   Physical Exam Triage Vital Signs ED Triage Vitals  Encounter Vitals Group     BP 03/27/24 1748 124/78     Girls Systolic BP Percentile --      Girls Diastolic BP Percentile --      Boys Systolic BP Percentile --      Boys Diastolic BP Percentile --      Pulse Rate 03/27/24 1748 80     Resp 03/27/24 1748 20     Temp 03/27/24 1748 98.1 F (36.7 C)     Temp Source 03/27/24 1748 Oral     SpO2 03/27/24 1748 97 %     Weight --      Height --      Head Circumference --      Peak Flow --      Pain Score 03/27/24 1750 8     Pain Loc --      Pain Education --      Exclude from Growth Chart --    No data found.  Updated Vital Signs BP 124/78 (BP Location: Left Arm)   Pulse 80   Temp 98.1 F (36.7 C) (Oral)   Resp 20   LMP  (LMP Unknown)   SpO2 97%   Visual Acuity Right Eye Distance:   Left Eye Distance:   Bilateral Distance:    Right Eye Near:   Left Eye Near:    Bilateral Near:     Physical Exam Vitals  and nursing note reviewed.  Constitutional:      Appearance: She is not  ill-appearing or toxic-appearing.  HENT:     Head: Normocephalic and atraumatic.     Right Ear: Hearing and external ear normal.     Left Ear: Hearing and external ear normal.     Nose: Nose normal.     Mouth/Throat:     Lips: Pink.  Eyes:     General: Lids are normal. Vision grossly intact. Gaze aligned appropriately.     Extraocular Movements: Extraocular movements intact.     Conjunctiva/sclera: Conjunctivae normal.  Neck:     Trachea: Trachea and phonation normal.      Comments: 7cm by 8cm area of swelling to the upper back.  Cardiovascular:     Rate and Rhythm: Normal rate and regular rhythm.     Heart sounds: Normal heart sounds, S1 normal and S2 normal.  Pulmonary:     Effort: Pulmonary effort is normal. No respiratory distress.     Breath sounds: Normal breath sounds and air entry.  Musculoskeletal:     Cervical back: Neck supple. Edema and tenderness present. No erythema, signs of trauma or torticollis. Pain with movement, spinous process tenderness and muscular tenderness present. Normal range of motion.  Lymphadenopathy:     Cervical: No cervical adenopathy.  Skin:    General: Skin is warm and dry.     Capillary Refill: Capillary refill takes less than 2 seconds.     Findings: No rash.  Neurological:     General: No focal deficit present.     Mental Status: She is alert and oriented to person, place, and time. Mental status is at baseline.     Cranial Nerves: No dysarthria or facial asymmetry.  Psychiatric:        Mood and Affect: Mood normal.        Speech: Speech normal.        Behavior: Behavior normal.        Thought Content: Thought content normal.        Judgment: Judgment normal.      UC Treatments / Results  Labs (all labs ordered are listed, but only abnormal results are displayed) Labs Reviewed - No data to display  EKG   Radiology DG Chest 2 View Result Date: 03/27/2024 CLINICAL DATA:  Soft tissue swelling at the lower C-spine, history of  breast cancer EXAM: CHEST - 2 VIEW COMPARISON:  09/26/2023 FINDINGS: No focal airspace consolidation, pleural effusion, or pneumothorax. No cardiomegaly.No acute fracture or destructive lesion. Multilevel thoracic osteophytosis. Cholecystectomy clips. IMPRESSION: No acute cardiopulmonary abnormality. Electronically Signed   By: Rogelia Myers M.D.   On: 03/27/2024 19:23    Procedures Procedures (including critical care time)  Medications Ordered in UC Medications - No data to display  Initial Impression / Assessment and Plan / UC Course  I have reviewed the triage vital signs and the nursing notes.  Pertinent labs & imaging results that were available during my care of the patient were reviewed by me and considered in my medical decision making (see chart for details).     Submuscular lipoma??  No obvious abnormalities on x-rays  Needs soft tissue ultrasound   *** Final Clinical Impressions(s) / UC Diagnoses   Final diagnoses:  Neck pain without injury     Discharge Instructions      It's unclear what's causing your neck pain and swelling.  Your x-rays appear normal.  Ultimately, you will need a soft  tissue ultrasound of the neck to further investigate the swelling.  You may take baclofen muscle relaxer as needed for muscle spasm pain. Baclofen muscle relaxer can cause drowsiness so mostly take this at bedtime as needed.  Continue taking Tylenol  1000 mg every 6 hours as needed for pain and swelling.  Please schedule a follow-up appointment with your primary care provider to have the soft tissue ultrasound ordered as we are unable to order this in the urgent care.  If you develop any new or worsening symptoms or if your symptoms do not start to improve, please return here or follow-up with your primary care provider. If your symptoms are severe, please go to the emergency room.       ED Prescriptions     Medication Sig Dispense Auth. Provider   baclofen  (LIORESAL) 10 MG tablet Take 1 tablet (10 mg total) by mouth 3 (three) times daily. 30 each Enedelia Dorna HERO, FNP      PDMP not reviewed this encounter.

## 2024-03-27 NOTE — ED Triage Notes (Signed)
 Pt c/o a knot at the top of her back x's 2 days causing pain.  St's pain goes into both shoulders

## 2024-03-27 NOTE — Telephone Encounter (Signed)
 FYI Only or Action Required?: FYI only for provider.  Patient was last seen in primary care on 11/25/2023 by Kayla Jeoffrey RAMAN, FNP.  Called Nurse Triage reporting [place].  Symptoms began at least 3 days.  Interventions attempted: Nothing.  Symptoms are: gradually worsening.  Triage Disposition: See PCP When Office is Open (Within 3 Days)  Patient/caregiver understands and will follow disposition?: Yes      Copied from CRM #[id number]. Topic: Clinical - Red Word Triage >> Mar 27, 2024 11:30 AM [place] MATSU wrote: Red Word that prompted transfer to Nurse Triage: lump and pain on the back of neck and size of golf ball and unsure had breast cancer before as patient is worried. Reason for Disposition  Caller can't describe it clearly  Answer Assessment - Initial Assessment Questions 1. APPEARANCE of LESION: What does it look like?      soft 2. SIZE: How big is it? (e.g., inches, cm; or compare to size of pinhead, tip of pen, eraser, coin, pea, grape, ping pong ball)      Golf ball 3. COLOR: What color is it? Is there more than one color?     na 4. SHAPE: What shape is it? (e.g., round, irregular)     na 5. RAISED: Does it stick up above the skin or is it flat? (e.g., raised or elevated)     yes 6. TENDER: Does it hurt when you touch it?  (Scale 1-10; or mild, moderate, severe)     Burning and hurting, like sun burn 7. LOCATION: Where is it located?      Back neck 8. ONSET: When did it first appear?      At least 3 days 9. NUMBER: Is there just one? or Are there others?     1 10. CAUSE: What do you think it is?       unknown 11. OTHER SYMPTOMS: Do you have any other symptoms? (e.g., fever)       Burning and pain and lump is getting bigger and more painful 12. PREGNANCY: Is there any chance you are pregnant? When was your last menstrual period?       Na  Pt has hx of breast cancer  Protocols used: Skin Lesion - Moles or Growths-A-AH

## 2024-03-27 NOTE — Discharge Instructions (Addendum)
 It's unclear what's causing your neck pain and swelling.  Your x-rays appear normal.  Ultimately, you will need a soft tissue ultrasound of the neck to further investigate the swelling.  You may take baclofen muscle relaxer as needed for muscle spasm pain. Baclofen muscle relaxer can cause drowsiness so mostly take this at bedtime as needed.  Continue taking Tylenol  1000 mg every 6 hours as needed for pain and swelling.  Please schedule a follow-up appointment with your primary care provider to have the soft tissue ultrasound ordered as we are unable to order this in the urgent care.  If you develop any new or worsening symptoms or if your symptoms do not start to improve, please return here or follow-up with your primary care provider. If your symptoms are severe, please go to the emergency room.

## 2024-03-28 ENCOUNTER — Encounter: Payer: Self-pay | Admitting: Family Medicine

## 2024-03-28 ENCOUNTER — Ambulatory Visit: Admitting: Family Medicine

## 2024-03-28 VITALS — BP 128/64 | HR 77 | Temp 98.1°F | Ht 65.0 in | Wt 186.0 lb

## 2024-03-28 DIAGNOSIS — M5412 Radiculopathy, cervical region: Secondary | ICD-10-CM | POA: Diagnosis not present

## 2024-03-28 DIAGNOSIS — R221 Localized swelling, mass and lump, neck: Secondary | ICD-10-CM

## 2024-03-28 MED ORDER — PREDNISONE 20 MG PO TABS: ORAL_TABLET | ORAL | 0 refills | Status: AC

## 2024-03-28 NOTE — Progress Notes (Signed)
 Subjective:    Patient ID: Kathryn Skinner, female    DOB: 23-Jun-1970, 53 y.o.   MRN: [id number]  Neck Pain     Patient is a 53 year old Caucasian female with a history of breast cancer in ~2018.  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  She underwent a double mastectomy.  The cancer spread to 1 lymph node.  Therefore she went through radiation treatments as well as chemotherapy.   3 to 4 days ago, she developed a pounding pain in the back of her neck radiating into her posterior right shoulder.  She had her family looked in that area and they noticed a soft tissue mass.  On her neck roughly around the level of C5-T2 there is a indistinct poorly circumscribed soft tissue mass that appears to be an accumulation of subcutaneous fat versus a lipoma versus a buffalo hump.  There is no discrete hard calcified tender mass.  I believe that this is a coincidental finding.  She went to an urgent care where chest x-ray was clear.  X-ray of the cervical spine showed degenerative disc disease at C5-C6 and C6-C7.  Patient reports of burning pain from the center of her neck to her posterior right shoulder 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   Thyroid  disease    hypothyroid   Past Surgical History:  Procedure Laterality Date   BREAST SURGERY     CHOLECYSTECTOMY     COLONOSCOPY N/A 11/27/2015   Procedure: COLONOSCOPY;  Surgeon: Lupita FORBES Commander, MD;  Location: [facility];  Service: Endoscopy;  Laterality: N/A;   MASTECTOMY Bilateral 2017   PORT-A-CATH REMOVAL     PORTACATH PLACEMENT     Current Outpatient  Medications on File Prior to Visit  Medication Sig Dispense Refill   atorvastatin  (LIPITOR) 20 MG tablet Take 1 tablet (20 mg total) by mouth daily. 90 tablet 3   baclofen (LIORESAL) 10 MG tablet Take 1 tablet (10 mg total) by mouth 3 (three) times daily. 30 each 0   fluticasone  (FLONASE ) 50 MCG/ACT nasal spray Place 2 sprays into both nostrils daily. (Patient not taking: Reported on 03/27/2024) 16 g 6   levocetirizine (XYZAL  ALLERGY 24HR) 5 MG tablet Take 1 tablet (5 mg total) by mouth every evening. (Patient not taking: Reported on 03/27/2024) 30 tablet 5   levothyroxine  (SYNTHROID ) 100 MCG tablet Take 1 tablet (100 mcg total) by mouth daily. 90 tablet 3   meloxicam  (MOBIC ) 15 MG tablet Take 1 tablet (15 mg total) by mouth daily. (Patient not taking: Reported on 03/27/2024) 30 tablet 2   No current facility-administered medications on file prior to visit.   Allergies  Allergen Reactions   Sulfamethoxazole-Trimethoprim Rash   Social History   Socioeconomic History   Marital status: Media planner    Spouse name: Not on file   Number of children: 1   Years of education: 12   Highest education level: Not on file  Occupational History   Occupation: disabled  Comment: vision impairment  Tobacco Use   Smoking status: Never   Smokeless tobacco: Never   Tobacco comments:    N/I  Vaping Use   Vaping status: Never Used  Substance and Sexual Activity   Alcohol use: No   Drug use: No   Sexual activity: Yes    Birth control/protection: Post-menopausal  Other Topics Concern   Not on file  Social History Narrative   Lives at home BF   Vision impaired since age 38, has eye disease that disables from work   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71, daughter, with new baby 2018   Social Drivers of Corporate investment banker Strain: Low Risk  (06/10/2022)   Overall Financial Resource Strain (CARDIA)    Difficulty of Paying Living Expenses: Not hard at all  Food Insecurity: No Food Insecurity  (06/10/2022)   Hunger Vital Sign    Worried About Running Out of Food in the Last Year: Never true    Ran Out of Food in the Last Year: Never true  Transportation Needs: No Transportation Needs (06/10/2022)   PRAPARE - Administrator, Civil Service (Medical): No    Lack of Transportation (Non-Medical): No  Physical Activity: Inactive (06/10/2022)   Exercise Vital Sign    Days of Exercise per Week: 0 days    Minutes of Exercise per Session: 0 min  Stress: No Stress Concern Present (06/10/2022)   [employer] of Occupational Health - Occupational Stress Questionnaire    Feeling of Stress : Only a little  Social Connections: Socially Isolated (06/10/2022)   Social Connection and Isolation Panel    Frequency of Communication with Friends and Family: More than three times a week    Frequency of Social Gatherings with Friends and Family: Once a week    Attends Religious Services: Never    Database administrator or Organizations: No    Attends Banker Meetings: Never    Marital Status: Divorced  Catering manager Violence: Not At Risk (06/10/2022)   Humiliation, Afraid, Rape, and Kick questionnaire    Fear of Current or Ex-Partner: No    Emotionally Abused: No    Physically Abused: No    Sexually Abused: No   Family History  Problem Relation Age of Onset   Thyroid  disease Maternal Grandmother    Emphysema Maternal Grandmother    Cancer Maternal Grandmother        lung   Alcohol abuse Maternal Grandfather    Cancer Maternal Grandfather        pancreatic   Thyroid  disease Mother    Thyroid  disease Sister    Asthma Sister    Liver disease Maternal Aunt    Breast cancer Maternal Uncle    Other Maternal Uncle        eye disease     Review of Systems  Musculoskeletal:  Positive for neck pain.       Objective:   Physical Exam Vitals reviewed.  Constitutional:      General: She is not in acute distress.    Appearance: Normal appearance. She is  not ill-appearing or toxic-appearing.  Neck:   Cardiovascular:     Rate and Rhythm: Normal rate and regular rhythm.     Heart sounds: Normal heart sounds. No murmur heard.    No friction rub. No gallop.  Pulmonary:     Effort: Pulmonary effort is normal. No respiratory distress.     Breath sounds: Normal breath  sounds. No wheezing, rhonchi or rales.  Abdominal:     General: Bowel sounds are normal. There is no distension.     Tenderness: There is no abdominal tenderness. There is no guarding or rebound.  Musculoskeletal:     Cervical back: No erythema, signs of trauma, rigidity or crepitus. No pain with movement, spinous process tenderness or muscular tenderness. Normal range of motion.     Right lower leg: No edema.     Left lower leg: No edema.  Neurological:     Mental Status: She is alert.           Assessment & Plan:   Neck mass  Cervical radiculopathy I believe the patient is having cervical radiculopathy likely from a herniated disc in her neck.  Begin prednisone  taper pack.  If pain is not improving consider an MRI of the cervical spine.  I believe the mass in her neck is likely an accumulation of subcutaneous fat similar to a buffalo hump versus a lipoma.  Patient is concerned because of her history of breast cancer.  I will obtain an ultrasound of that area to rule out any discernible mass that I believe is most likely benign lipoma.  I do not feel that it has anything to do with the burning pain.  I believe that the pain caused the patient to look closer at this area and noticed the mass

## 2024-03-29 ENCOUNTER — Inpatient Hospital Stay: Admission: RE | Admit: 2024-03-29 | Discharge: 2024-03-29 | Attending: Family Medicine | Admitting: Family Medicine

## 2024-03-29 DIAGNOSIS — R221 Localized swelling, mass and lump, neck: Secondary | ICD-10-CM

## 2024-03-31 ENCOUNTER — Ambulatory Visit: Payer: Self-pay | Admitting: Family Medicine

## 2024-03-31 ENCOUNTER — Telehealth: Payer: Self-pay

## 2024-03-31 NOTE — Telephone Encounter (Signed)
 Copied from CRM [id number]. Topic: Clinical - Lab/Test Results >> Mar 31, 2024  8:32 AM Selinda RAMAN wrote: Reason for CRM: The patient called in stating she missed a call this morning. I checked and it looks like someone tried to call her regarding her U/S results. I called and spoke with Delon and she said she will pass the message to Ronal Bradley to call the patient back. Please assist patient further.

## 2024-04-28 DIAGNOSIS — H6991 Unspecified Eustachian tube disorder, right ear: Secondary | ICD-10-CM | POA: Diagnosis not present

## 2024-04-28 DIAGNOSIS — J069 Acute upper respiratory infection, unspecified: Secondary | ICD-10-CM | POA: Diagnosis not present

## 2024-05-26 ENCOUNTER — Other Ambulatory Visit: Payer: Self-pay | Admitting: Family Medicine

## 2024-05-26 NOTE — Telephone Encounter (Signed)
 Copied from CRM #[id number]. Topic: Clinical - Medication Refill >> May 26, 2024  9:54 AM Everette C wrote: Medication: levothyroxine  (SYNTHROID ) 100 MCG tablet [[id number]]  Has the patient contacted their pharmacy? No (Agent: If no, request that the patient contact the pharmacy for the refill. If patient does not wish to contact the pharmacy document the reason why and proceed with request.) (Agent: If yes, when and what did the pharmacy advise?)  This is the patient's preferred pharmacy:  [id number] - [place], [place] - [address] AT [facility] [address]  [place] [address] Phone: [phone] Fax: [phone]  Is this the correct pharmacy for this prescription? Yes If no, delete pharmacy and type the correct one.   Has the prescription been filled recently? Yes  Is the patient out of the medication? Yes  Has the patient been seen for an appointment in the last year OR does the patient have an upcoming appointment? Yes  Can we respond through MyChart? No  Agent: Please be advised that Rx refills may take up to 3 business days. We ask that you follow-up with your pharmacy.

## 2024-05-29 NOTE — Telephone Encounter (Signed)
 Too soon for refill.  Requested Prescriptions  Pending Prescriptions Disp Refills   levothyroxine  (SYNTHROID ) 100 MCG tablet 90 tablet 3    Sig: Take 1 tablet (100 mcg total) by mouth daily.     Endocrinology:  Hypothyroid Agents Failed - 05/29/2024  3:29 PM      Failed - TSH in normal range and within 360 days    TSH  Date Value Ref Range Status  09/21/2023 6.74 (H) mIU/L Final    Comment:              Reference Range .           > or = 20 Years  0.40-4.50 .                Pregnancy Ranges           First trimester    0.26-2.66           Second trimester   0.55-2.73           Third trimester    0.43-2.91          Passed - Valid encounter within last 12 months    Recent Outpatient Visits           2 months ago Neck mass   [facility] Duanne, Butler DASEN, MD   6 months ago Bug bite, initial encounter   [facility] Kayla Jeoffrey RAMAN, FNP   7 months ago Sore throat   [facility] Duanne, Butler DASEN, MD   8 months ago Pure hypercholesterolemia   [place] Pickard, Butler DASEN, MD

## 2024-05-31 ENCOUNTER — Other Ambulatory Visit: Payer: Self-pay | Admitting: Family Medicine

## 2024-05-31 NOTE — Telephone Encounter (Signed)
 Copied from CRM #[id number]. Topic: Clinical - Prescription Issue >> May 31, 2024  2:27 PM Tiffini S wrote: Reason for CRM: Patient states that she is completely out of the medication for levothyroxine  (SYNTHROID ) 100 MCG tablet   Request refused: Patient has requested refill too soon  Sig - Route: Take 1 tablet (100 mcg total) by mouth daily. - Oral   Patient is asking for a call back at [phone] to discuss medication

## 2024-06-01 NOTE — Telephone Encounter (Signed)
 Pt called to report that she has been out of her supply since last Thursday, pt wants to speak to the clinic today regarding why this was denied for being too soon please advise   Best contact: [phone]

## 2024-06-02 NOTE — Telephone Encounter (Signed)
 Pt called back to f/u. I informed her that based on the report, it was refused for refills due to requesting refill too soon. Pt states she has no more meds left. Please call and advise.

## 2024-06-05 NOTE — Telephone Encounter (Signed)
 Copied from CRM #[id number]. Topic: Clinical - Prescription Issue >> May 31, 2024  2:27 PM Tiffini S wrote: Reason for CRM: Patient states that she is completely out of the medication for levothyroxine  (SYNTHROID ) 100 MCG tablet   Request refused: Patient has requested refill too soon  Sig - Route: Take 1 tablet (100 mcg total) by mouth daily. - Oral   Patient is asking for a call back at [phone] to discuss medication
